# Patient Record
Sex: Female | Born: 1935 | Hispanic: No | Marital: Married | Smoking: Former smoker
Health system: Southern US, Community
[De-identification: ages and names within clinical notes are randomized; demographics above are authoritative.]

## PROBLEM LIST (undated history)

## (undated) DIAGNOSIS — E119 Type 2 diabetes mellitus without complications: Secondary | ICD-10-CM

## (undated) DIAGNOSIS — I739 Peripheral vascular disease, unspecified: Secondary | ICD-10-CM

## (undated) DIAGNOSIS — Z8719 Personal history of other diseases of the digestive system: Secondary | ICD-10-CM

## (undated) DIAGNOSIS — J189 Pneumonia, unspecified organism: Secondary | ICD-10-CM

## (undated) DIAGNOSIS — Z950 Presence of cardiac pacemaker: Secondary | ICD-10-CM

## (undated) DIAGNOSIS — J45909 Unspecified asthma, uncomplicated: Secondary | ICD-10-CM

## (undated) DIAGNOSIS — M1990 Unspecified osteoarthritis, unspecified site: Secondary | ICD-10-CM

## (undated) DIAGNOSIS — I2510 Atherosclerotic heart disease of native coronary artery without angina pectoris: Secondary | ICD-10-CM

## (undated) DIAGNOSIS — M810 Age-related osteoporosis without current pathological fracture: Secondary | ICD-10-CM

## (undated) DIAGNOSIS — K219 Gastro-esophageal reflux disease without esophagitis: Secondary | ICD-10-CM

## (undated) DIAGNOSIS — I219 Acute myocardial infarction, unspecified: Secondary | ICD-10-CM

## (undated) DIAGNOSIS — I10 Essential (primary) hypertension: Secondary | ICD-10-CM

## (undated) HISTORY — PX: ABDOMINAL HYSTERECTOMY: SHX81

## (undated) HISTORY — PX: EYE SURGERY: SHX253

## (undated) HISTORY — PX: APPENDECTOMY: SHX54

## (undated) HISTORY — PX: KNEE ARTHROSCOPY: SUR90

## (undated) HISTORY — PX: BREAST SURGERY: SHX581

## (undated) HISTORY — PX: FOOT SURGERY: SHX648

## (undated) HISTORY — PX: OTHER SURGICAL HISTORY: SHX169

## (undated) HISTORY — PX: BACK SURGERY: SHX140

## (undated) HISTORY — PX: TONSILLECTOMY: SUR1361

## (undated) HISTORY — PX: COLONOSCOPY: SHX174

## (undated) HISTORY — PX: CARDIAC CATHETERIZATION: SHX172

## (undated) HISTORY — PX: CORONARY ANGIOPLASTY: SHX604

## (undated) HISTORY — PX: ROTATOR CUFF REPAIR: SHX139

---

## 2011-10-30 ENCOUNTER — Encounter (INDEPENDENT_AMBULATORY_CARE_PROVIDER_SITE_OTHER): Payer: Medicare Other | Admitting: Ophthalmology

## 2011-10-30 ENCOUNTER — Encounter (INDEPENDENT_AMBULATORY_CARE_PROVIDER_SITE_OTHER): Payer: Self-pay | Admitting: Ophthalmology

## 2011-10-30 DIAGNOSIS — E11319 Type 2 diabetes mellitus with unspecified diabetic retinopathy without macular edema: Secondary | ICD-10-CM

## 2011-10-30 DIAGNOSIS — H43819 Vitreous degeneration, unspecified eye: Secondary | ICD-10-CM

## 2011-10-30 DIAGNOSIS — H26499 Other secondary cataract, unspecified eye: Secondary | ICD-10-CM

## 2011-10-30 DIAGNOSIS — E1139 Type 2 diabetes mellitus with other diabetic ophthalmic complication: Secondary | ICD-10-CM

## 2011-11-12 ENCOUNTER — Encounter (INDEPENDENT_AMBULATORY_CARE_PROVIDER_SITE_OTHER): Payer: Medicare Other | Admitting: Ophthalmology

## 2011-11-12 DIAGNOSIS — H2700 Aphakia, unspecified eye: Secondary | ICD-10-CM

## 2018-11-24 ENCOUNTER — Other Ambulatory Visit: Payer: Self-pay

## 2018-11-24 DIAGNOSIS — R0989 Other specified symptoms and signs involving the circulatory and respiratory systems: Secondary | ICD-10-CM

## 2018-12-21 ENCOUNTER — Ambulatory Visit (INDEPENDENT_AMBULATORY_CARE_PROVIDER_SITE_OTHER): Payer: Medicare Other | Admitting: Vascular Surgery

## 2018-12-21 ENCOUNTER — Encounter: Payer: Self-pay | Admitting: Vascular Surgery

## 2018-12-21 ENCOUNTER — Other Ambulatory Visit (HOSPITAL_COMMUNITY): Payer: Medicare Other

## 2018-12-21 ENCOUNTER — Encounter: Payer: Medicare Other | Admitting: Vascular Surgery

## 2018-12-21 VITALS — BP 148/86 | HR 72 | Temp 97.1°F | Resp 16 | Ht 62.0 in | Wt 146.6 lb

## 2018-12-21 DIAGNOSIS — I6521 Occlusion and stenosis of right carotid artery: Secondary | ICD-10-CM

## 2018-12-21 NOTE — Progress Notes (Signed)
V[facility] and [facility]in Specialist of [place]  Patient name: Lorraine Hill MRN: [id number] DOB: 1931/10/27 Sex: female  REASON FOR CONSULT: Evaluation of asymptomatic right carotid stenosis  HPI: Lorraine Reellma Adkins is a 84 y.o. female, who is here today for evaluation of asymptomatic right carotid stenosis.  She was found to have a carotid bruit by Dr. Georganna SkeansPainter and subsequently underwent carotid duplex for further evaluation.  She specifically denies any prior history of amaurosis fugax, transient ischemic attack, a aphasia or stroke.  She does report occasional feeling of this when in bed rolling from side to side.  Have history of prior coronary disease having undergone stenting in March 2018.  Also has pacemaker.  She lives independently with her husband.  Her daughter is here with her today and helps take care of her as well.  Smoke cigarettes having quit in 1975.  He is a insulin-dependent diabetic.  History reviewed. No pertinent past medical history.  History reviewed. No pertinent family history.  SOCIAL HISTORY: Social History   Socioeconomic History  . Marital status: Unknown    Spouse name: Not on file  . Number of children: Not on file  . Years of education: Not on file  . Highest education level: Not on file  Occupational History  . Not on file  Social Needs  . Financial resource strain: Not on file  . Food insecurity:    Worry: Not on file    Inability: Not on file  . Transportation needs:    Medical: Not on file    Non-medical: Not on file  Tobacco Use  . Smoking status: Not on file  Substance and Sexual Activity  . Alcohol use: Not on file  . Drug use: Not on file  . Sexual activity: Not on file  Lifestyle  . Physical activity:    Days per week: Not on file    Minutes per session: Not on file  . Stress: Not on file  Relationships  . Social connections:    Talks on phone: Not on file    Gets together: Not on file    Attends  religious service: Not on file    Active member of club or organization: Not on file    Attends meetings of clubs or organizations: Not on file    Relationship status: Not on file  . Intimate partner violence:    Fear of current or ex partner: Not on file    Emotionally abused: Not on file    Physically abused: Not on file    Forced sexual activity: Not on file  Other Topics Concern  . Not on file  Social History Narrative  . Not on file    Allergies  Allergen Reactions  . Lipitor [Atorvastatin]   . Penicillins     Current Outpatient Medications  Medication Sig Dispense Refill  . aspirin EC 81 MG tablet Take 81 mg by mouth daily.    . clopidogrel (PLAVIX) 75 MG tablet Take 75 mg by mouth daily.    . ergocalciferol (VITAMIN D2) 1.25 MG (50000 UT) capsule Take 50,000 Units by mouth once a week.    . insulin glargine (LANTUS) 100 UNIT/ML injection Inject into the skin daily.    . sacubitril-valsartan (ENTRESTO) 24-26 MG Take 1 tablet by mouth 2 (two) times daily.    . sertraline (ZOLOFT) 50 MG tablet Take 50 mg by mouth daily.    . vitamin B-12 (CYANOCOBALAMIN) 1000 MCG tablet Take 1,000 mcg by mouth daily.  No current facility-administered medications for this visit.     REVIEW OF SYSTEMS:  [X]  denotes positive finding, [ ]  denotes negative finding Cardiac  Comments:  Chest pain or chest pressure:    Shortness of breath upon exertion:    Short of breath when lying flat:    Irregular heart rhythm: x       Vascular    Pain in calf, thigh, or hip brought on by ambulation:    Pain in feet at night that wakes you up from your sleep:     Blood clot in your veins:    Leg swelling:         Pulmonary    Oxygen at home:    Productive cough:     Wheezing:         Neurologic    Sudden weakness in arms or legs:     Sudden numbness in arms or legs:     Sudden onset of difficulty speaking or slurred speech:    Temporary loss of vision in one eye:     Problems with  dizziness:         Gastrointestinal    Blood in stool:     Vomited blood:         Genitourinary    Burning when urinating:     Blood in urine:        Psychiatric    Major depression:         Hematologic    Bleeding problems:    Problems with blood clotting too easily:        Skin    Rashes or ulcers:        Constitutional    Fever or chills:      PHYSICAL EXAM: Vitals:   12/21/18 1427  BP: (!) 148/86  Pulse: 72  Resp: 16  Temp: (!) 97.1 F (36.2 C)  TempSrc: Temporal  Weight: 146 lb 9.6 oz (66.5 kg)  Height: 5\' 2"  (1.575 m)    GENERAL: The patient is a well-nourished female, in no acute distress. The vital signs are documented above. CARDIOVASCULAR: Soft right carotid bruit.  No bruit on the left.  2+ radial and 2+ dorsalis pedis pulses bilaterally PULMONARY: There is good air exchange  ABDOMEN: Soft and non-tender  MUSCULOSKELETAL: There are no major deformities or cyanosis. NEUROLOGIC: No focal weakness or paresthesias are detected. SKIN: There are no ulcers or rashes noted. PSYCHIATRIC: The patient has a normal affect.  DATA:  Carotid duplex from 01/12/2017 was reviewed.  This reveals systolic velocities of 290 cm/s in the right internal carotid and end-diastolic velocities of 68 cm/s.  Interpretation was of 70 to 80% stenosis.  MEDICAL ISSUES: Asymptomatic right internal carotid artery stenosis.  I had a long discussion with the patient and her daughter regarding this.  She is at the threshold of recommendation for elective endarterectomy for asymptomatic disease.  I have recommended CT angiogram of her neck for further delineation of her degree of stenosis in the level of stenosis in the internal carotid artery.  We will make further recommendations regarding ongoing duplex surveillance versus right carotid endarterectomy based on her CT scan.  I did explain the procedure of endarterectomy with expected 1 day hospitalization and expected 1 to 1-1/2% risk of  stroke with surgery.  We will coordinate her outpatient CT scan and office visit in the next several weeks.   Larina Earthlyodd F. Early, MD FACS [facility] Office Tel [phone]  [phone] Pager [phone]

## 2018-12-22 ENCOUNTER — Encounter: Payer: Self-pay | Admitting: Cardiovascular Disease

## 2018-12-22 ENCOUNTER — Other Ambulatory Visit: Payer: Self-pay

## 2018-12-22 DIAGNOSIS — I6521 Occlusion and stenosis of right carotid artery: Secondary | ICD-10-CM

## 2018-12-22 DIAGNOSIS — R0989 Other specified symptoms and signs involving the circulatory and respiratory systems: Secondary | ICD-10-CM

## 2019-01-11 ENCOUNTER — Ambulatory Visit (HOSPITAL_COMMUNITY)
Admission: RE | Admit: 2019-01-11 | Discharge: 2019-01-11 | Disposition: A | Discharge status: Home / Self Care | Payer: Medicare Other | Source: Ambulatory Visit | Attending: Vascular Surgery | Admitting: Vascular Surgery

## 2019-01-11 ENCOUNTER — Encounter: Payer: Self-pay | Admitting: Vascular Surgery

## 2019-01-11 ENCOUNTER — Ambulatory Visit (INDEPENDENT_AMBULATORY_CARE_PROVIDER_SITE_OTHER): Payer: Medicare Other | Admitting: Vascular Surgery

## 2019-01-11 VITALS — BP 188/83 | HR 66 | Temp 96.6°F | Resp 16 | Ht 62.0 in | Wt 146.0 lb

## 2019-01-11 DIAGNOSIS — I6521 Occlusion and stenosis of right carotid artery: Secondary | ICD-10-CM | POA: Insufficient documentation

## 2019-01-11 DIAGNOSIS — R0989 Other specified symptoms and signs involving the circulatory and respiratory systems: Secondary | ICD-10-CM | POA: Insufficient documentation

## 2019-01-11 LAB — POCT I-STAT CREATININE: Creatinine, Ser: 1.4 mg/dL — ABNORMAL HIGH (ref 0.44–1.00)

## 2019-01-11 MED ORDER — IOPAMIDOL (ISOVUE-370) INJECTION 76%
100.0000 mL | Freq: Once | INTRAVENOUS | Status: AC | PRN
  Administered 2019-01-11: 75 mL via INTRAVENOUS

## 2019-01-11 NOTE — Progress Notes (Signed)
Vascular and Vein Specialist of [facility]  Patient name: Lorraine Hill MRN: [id number] DOB: Oct 20, 1931 Sex: female   Seen today in our [place] office  REASON FOR VISIT: Follow-up right carotid stenosis with CT angiogram  HPI: Lorraine Hill is a 85 y.o. female here today for follow-up.  In her several weeks ago with carotid duplex showing high-grade right carotid stenosis.  She is asymptomatic.  She continues to have no neurologic deficits.  She underwent CT angiogram today at [facility] and is here today with her daughter for further discussion.  History reviewed. No pertinent past medical history.  History reviewed. No pertinent family history.  SOCIAL HISTORY: Social History   Tobacco Use  . Smoking status: Not on file  Substance Use Topics  . Alcohol use: Not on file    Allergies  Allergen Reactions  . Lipitor [Atorvastatin]   . Penicillins     Current Outpatient Medications  Medication Sig Dispense Refill  . aspirin EC 81 MG tablet Take 81 mg by mouth daily.    . clopidogrel (PLAVIX) 75 MG tablet Take 75 mg by mouth daily.    . ergocalciferol (VITAMIN D2) 1.25 MG (50000 UT) capsule Take 50,000 Units by mouth once a week.    . insulin glargine (LANTUS) 100 UNIT/ML injection Inject into the skin daily.    . sacubitril-valsartan (ENTRESTO) 24-26 MG Take 1 tablet by mouth 2 (two) times daily.    . sertraline (ZOLOFT) 50 MG tablet Take 50 mg by mouth daily.    . vitamin B-12 (CYANOCOBALAMIN) 1000 MCG tablet Take 1,000 mcg by mouth daily.     No current facility-administered medications for this visit.     REVIEW OF SYSTEMS:  [X]  denotes positive finding, [ ]  denotes negative finding Cardiac  Comments:  Chest pain or chest pressure:    Shortness of breath upon exertion:    Short of breath when lying flat:    Irregular heart rhythm: x       Vascular    Pain in calf, thigh, or hip brought on by ambulation:    Pain in  feet at night that wakes you up from your sleep:     Blood clot in your veins:    Leg swelling:           PHYSICAL EXAM: Vitals:   01/11/19 1034  BP: (!) 188/83  Pulse: 66  Resp: 16  Temp: (!) 96.6 F (35.9 C)  TempSrc: Temporal  Weight: 146 lb (66.2 kg)  Height: 5\' 2"  (1.575 m)    GENERAL: The patient is a well-nourished female, in no acute distress. The vital signs are documented above. CARDIOVASCULAR: 2+ radial pulses bilaterally.  Carotid artery was soft right carotid bruit PULMONARY: There is good air exchange  MUSCULOSKELETAL: There are no major deformities or cyanosis. NEUROLOGIC: No focal weakness or paresthesias are detected. SKIN: There are no ulcers or rashes noted. PSYCHIATRIC: The patient has a normal affect.  DATA:  CT angiogram reveals high-grade 75 to 80% right internal carotid artery stenosis just at the carotid bifurcation.  The internal carotid artery above and the common carotid artery below are widely patent with minimal atherosclerotic change.  Her bifurcation is in the normal location and is not high.  MEDICAL ISSUES: I had a long discussion with the patient and her daughter present.  And that this degree of narrowing puts her at approximately 5 %/year risk of neurologic deficit.  I explained the surgical procedure for correcting this  with carotid endarterectomy.  I explained the 1 to 1-1/2% risk of stroke with surgery and the very slight risk of cranial nerve injury.  She is 37 but has no major medical issues and is very independent.  She wishes to proceed with endarterectomy.  She is holding her Plavix and continuing her aspirin currently.  We will schedule surgery at her earliest convenience    Larina Earthly, MD [facility] Vascular and Vein Specialists of [facility] Tel [phone] Pager [phone]

## 2019-01-11 NOTE — H&P (View-Only) (Signed)
  Vascular and Vein Specialist of [place]  Patient name: Lorraine Hill MRN: [id number] DOB: 08/23/1931 Sex: female   Seen today in our [place] office  REASON FOR VISIT: Follow-up right carotid stenosis with CT angiogram  HPI: Lorraine Hill is a 85 y.o. female here today for follow-up.  In her several weeks ago with carotid duplex showing high-grade right carotid stenosis.  She is asymptomatic.  She continues to have no neurologic deficits.  She underwent CT angiogram today at [facility] and is here today with her daughter for further discussion.  History reviewed. No pertinent past medical history.  History reviewed. No pertinent family history.  SOCIAL HISTORY: Social History   Tobacco Use  . Smoking status: Not on file  Substance Use Topics  . Alcohol use: Not on file    Allergies  Allergen Reactions  . Lipitor [Atorvastatin]   . Penicillins     Current Outpatient Medications  Medication Sig Dispense Refill  . aspirin EC 81 MG tablet Take 81 mg by mouth daily.    . clopidogrel (PLAVIX) 75 MG tablet Take 75 mg by mouth daily.    . ergocalciferol (VITAMIN D2) 1.25 MG (50000 UT) capsule Take 50,000 Units by mouth once a week.    . insulin glargine (LANTUS) 100 UNIT/ML injection Inject into the skin daily.    . sacubitril-valsartan (ENTRESTO) 24-26 MG Take 1 tablet by mouth 2 (two) times daily.    . sertraline (ZOLOFT) 50 MG tablet Take 50 mg by mouth daily.    . vitamin B-12 (CYANOCOBALAMIN) 1000 MCG tablet Take 1,000 mcg by mouth daily.     No current facility-administered medications for this visit.     REVIEW OF SYSTEMS:  [X] denotes positive finding, [ ] denotes negative finding Cardiac  Comments:  Chest pain or chest pressure:    Shortness of breath upon exertion:    Short of breath when lying flat:    Irregular heart rhythm: x       Vascular    Pain in calf, thigh, or hip brought on by ambulation:    Pain in  feet at night that wakes you up from your sleep:     Blood clot in your veins:    Leg swelling:           PHYSICAL EXAM: Vitals:   01/11/19 1034  BP: (!) 188/83  Pulse: 66  Resp: 16  Temp: (!) 96.6 F (35.9 C)  TempSrc: Temporal  Weight: 146 lb (66.2 kg)  Height: 5' 2" (1.575 m)    GENERAL: The patient is a well-nourished female, in no acute distress. The vital signs are documented above. CARDIOVASCULAR: 2+ radial pulses bilaterally.  Carotid artery was soft right carotid bruit PULMONARY: There is good air exchange  MUSCULOSKELETAL: There are no major deformities or cyanosis. NEUROLOGIC: No focal weakness or paresthesias are detected. SKIN: There are no ulcers or rashes noted. PSYCHIATRIC: The patient has a normal affect.  DATA:  CT angiogram reveals high-grade 75 to 80% right internal carotid artery stenosis just at the carotid bifurcation.  The internal carotid artery above and the common carotid artery below are widely patent with minimal atherosclerotic change.  Her bifurcation is in the normal location and is not high.  MEDICAL ISSUES: I had a long discussion with the patient and her daughter present.  And that this degree of narrowing puts her at approximately 5 %/year risk of neurologic deficit.  I explained the surgical procedure for correcting this   with carotid endarterectomy.  I explained the 1 to 1-1/2% risk of stroke with surgery and the very slight risk of cranial nerve injury.  She is 85 but has no major medical issues and is very independent.  She wishes to proceed with endarterectomy.  She is holding her Plavix and continuing her aspirin currently.  We will schedule surgery at her earliest convenience    Larina Earthly, MD [facility] Vascular and Vein Specialists of [facility] Tel [phone] Pager [phone]

## 2019-01-12 ENCOUNTER — Telehealth: Payer: Self-pay

## 2019-01-12 ENCOUNTER — Other Ambulatory Visit: Payer: Self-pay

## 2019-01-12 NOTE — Telephone Encounter (Signed)
Spoke to pt's daughter Fannie Knee to schedule pt's surgery. Gave daughter pre-op instructions and per Dr. Arbie Cookey to continue holding Plavix. Daughter verbalized understanding.

## 2019-01-21 ENCOUNTER — Encounter (HOSPITAL_COMMUNITY)
Admission: RE | Admit: 2019-01-21 | Discharge: 2019-01-21 | Disposition: A | Discharge status: Home / Self Care | Payer: Medicare Other | Source: Ambulatory Visit | Attending: Vascular Surgery | Admitting: Vascular Surgery

## 2019-01-21 ENCOUNTER — Other Ambulatory Visit: Payer: Self-pay

## 2019-01-21 ENCOUNTER — Telehealth: Payer: Self-pay | Admitting: *Deleted

## 2019-01-21 ENCOUNTER — Encounter (HOSPITAL_COMMUNITY): Payer: Self-pay

## 2019-01-21 DIAGNOSIS — N390 Urinary tract infection, site not specified: Secondary | ICD-10-CM

## 2019-01-21 DIAGNOSIS — Z01812 Encounter for preprocedural laboratory examination: Secondary | ICD-10-CM | POA: Diagnosis not present

## 2019-01-21 HISTORY — DX: Age-related osteoporosis without current pathological fracture: M81.0

## 2019-01-21 HISTORY — DX: Unspecified osteoarthritis, unspecified site: M19.90

## 2019-01-21 HISTORY — DX: Atherosclerotic heart disease of native coronary artery without angina pectoris: I25.10

## 2019-01-21 HISTORY — DX: Pneumonia, unspecified organism: J18.9

## 2019-01-21 HISTORY — DX: Gastro-esophageal reflux disease without esophagitis: K21.9

## 2019-01-21 HISTORY — DX: Type 2 diabetes mellitus without complications: E11.9

## 2019-01-21 HISTORY — DX: Essential (primary) hypertension: I10

## 2019-01-21 HISTORY — DX: Personal history of other diseases of the digestive system: Z87.19

## 2019-01-21 HISTORY — DX: Peripheral vascular disease, unspecified: I73.9

## 2019-01-21 HISTORY — DX: Presence of cardiac pacemaker: Z95.0

## 2019-01-21 HISTORY — DX: Acute myocardial infarction, unspecified: I21.9

## 2019-01-21 HISTORY — DX: Unspecified asthma, uncomplicated: J45.909

## 2019-01-21 LAB — COMPREHENSIVE METABOLIC PANEL
ALT: 11 U/L (ref 0–44)
AST: 17 U/L (ref 15–41)
Albumin: 3.7 g/dL (ref 3.5–5.0)
Alkaline Phosphatase: 76 U/L (ref 38–126)
Anion gap: 11 (ref 5–15)
BUN: 27 mg/dL — ABNORMAL HIGH (ref 8–23)
CO2: 14 mmol/L — ABNORMAL LOW (ref 22–32)
Calcium: 8.8 mg/dL — ABNORMAL LOW (ref 8.9–10.3)
Chloride: 111 mmol/L (ref 98–111)
Creatinine, Ser: 1.5 mg/dL — ABNORMAL HIGH (ref 0.44–1.00)
GFR calc Af Amer: 36 mL/min — ABNORMAL LOW (ref >60)
GFR calc non Af Amer: 31 mL/min — ABNORMAL LOW (ref >60)
Glucose, Bld: 121 mg/dL — ABNORMAL HIGH (ref 70–99)
Potassium: 4.7 mmol/L (ref 3.5–5.1)
Sodium: 136 mmol/L (ref 135–145)
Total Bilirubin: 0.7 mg/dL (ref 0.3–1.2)
Total Protein: 7.6 g/dL (ref 6.5–8.1)

## 2019-01-21 LAB — GLUCOSE, CAPILLARY: Glucose-Capillary: 129 mg/dL — ABNORMAL HIGH (ref 70–99)

## 2019-01-21 LAB — CBC
HCT: 38.5 % (ref 36.0–46.0)
Hemoglobin: 12.6 g/dL (ref 12.0–15.0)
MCH: 28.8 pg (ref 26.0–34.0)
MCHC: 32.7 g/dL (ref 30.0–36.0)
MCV: 88.1 fL (ref 80.0–100.0)
Platelets: 168 10*3/uL (ref 150–400)
RBC: 4.37 MIL/uL (ref 3.87–5.11)
RDW: 13 % (ref 11.5–15.5)
WBC: 7.8 10*3/uL (ref 4.0–10.5)
nRBC: 0 % (ref 0.0–0.2)

## 2019-01-21 LAB — URINALYSIS, ROUTINE W REFLEX MICROSCOPIC
Bilirubin Urine: NEGATIVE
Glucose, UA: NEGATIVE mg/dL
Ketones, ur: NEGATIVE mg/dL
Nitrite: NEGATIVE
Protein, ur: 30 mg/dL — AB
Specific Gravity, Urine: 1.016 (ref 1.005–1.030)
Squamous Epithelial / HPF: >50 — ABNORMAL HIGH (ref 0–5)
pH: 5 (ref 5.0–8.0)

## 2019-01-21 LAB — ABO/RH: ABO/RH(D): A NEG

## 2019-01-21 LAB — SURGICAL PCR SCREEN
MRSA, PCR: NEGATIVE
Staphylococcus aureus: NEGATIVE

## 2019-01-21 LAB — PROTIME-INR
INR: 1.11
Prothrombin Time: 14.2 seconds (ref 11.4–15.2)

## 2019-01-21 LAB — TYPE AND SCREEN
ABO/RH(D): A NEG
Antibody Screen: NEGATIVE

## 2019-01-21 LAB — APTT: aPTT: 33 seconds (ref 24–36)

## 2019-01-21 MED ORDER — CIPROFLOXACIN HCL 500 MG PO TABS: 500.0000 mg | ORAL_TABLET | Freq: Two times a day (BID) | ORAL | 0 refills | Status: DC

## 2019-01-21 NOTE — Progress Notes (Signed)
Notified, Joyce Gross, RN at Dr. Bosie Helper office of UA results.

## 2019-01-21 NOTE — Telephone Encounter (Signed)
Received a call from Lorraine Hill at PAT to alert Dr. Arbie Cookey that this Lorraine Hill had an abnormal UA at her preop testing (Right CEA is scheduled for 01-28-2019). I have called the [facility] pharmacy in [place], [place] per her daughter's request. Lorraine Hill will start Cipro today according to [facility] protocol. Daughter, Lorraine Hill, voice agreement and understanding of the need for this antibiotic.

## 2019-01-21 NOTE — Pre-Procedure Instructions (Addendum)
Lorraine Hill  01/21/2019    Your procedure is scheduled on Thursday, January 28, 2019 at 7:30 AM.   Report to [facility] Entrance "A" Admitting Office at 5:30 AM.   Call this number if you have problems the morning of surgery: [phone]   Questions prior to day of surgery, please call [phone] between 8 & 4 PM.   Remember:  Do not eat or drink after midnight Wednesday. 01/27/19.  Take these medicines the morning of surgery with A SIP OF WATER: Aspirin, Omeprazole (Prilosec), Sertraline (Zoloft)  Take 1/2 of your regular dose of Lantus Insulin the morning of surgery. You will take 5 units.   How to Manage Your Diabetes Before Surgery   Why is it important to control my blood sugar before and after surgery?   Improving blood sugar levels before and after surgery helps healing and can limit problems.  A way of improving blood sugar control is eating a healthy diet by:  - Eating less sugar and carbohydrates  - Increasing activity/exercise  - Talk with your doctor about reaching your blood sugar goals  High blood sugars (greater than 180 mg/dL) can raise your risk of infections and slow down your recovery so you will need to focus on controlling your diabetes during the weeks before surgery.  Make sure that the doctor who takes care of your diabetes knows about your planned surgery including the date and location.  How do I manage my blood sugars before surgery?   Check your blood sugar at least 4 times a day, 2 days before surgery to make sure that they are not too high or low.  Check your blood sugar the morning of your surgery when you wake up and every 2 hours until you get to the Short-Stay unit.  Treat a low blood sugar (less than 70 mg/dL) with 1/2 cup of clear juice (cranberry or apple), 4 glucose tablets, OR glucose gel.  Recheck blood sugar in 15 minutes after treatment (to make sure it is greater than 70 mg/dL).  If blood sugar is not greater than 70  mg/dL on re-check, call [phone] for further instructions.   Report your blood sugar to the Short-Stay nurse when you get to Short-Stay.  References:  [facility], 2007 "How to Manage your Diabetes Before and After Surgery".   Do not wear jewelry, make-up or nail polish.  Do not wear lotions, powders, perfumes or deodorant.  Do not shave 48 hours prior to surgery.    Do not bring valuables to the hospital.  [facility] is not responsible for any belongings or valuables.  Contacts, dentures or bridgework may not be worn into surgery.  Leave your suitcase in the car.  After surgery it may be brought to your room.  For patients admitted to the hospital, discharge time will be determined by your treatment team.  [facility] - Preparing for Surgery  Before surgery, you can play an important role.  Because skin is not sterile, your skin needs to be as free of germs as possible.  You can reduce the number of germs on you skin by washing with CHG (chlorahexidine gluconate) soap before surgery.  CHG is an antiseptic cleaner which kills germs and bonds with the skin to continue killing germs even after washing.  Oral Hygiene is also important in reducing the risk of infection.  Remember to brush your teeth with your regular toothpaste the morning of surgery.  Please DO NOT  use if you have an allergy to CHG or antibacterial soaps.  If your skin becomes reddened/irritated stop using the CHG and inform your nurse when you arrive at Short Stay.  Do not shave (including legs and underarms) for at least 48 hours prior to the first CHG shower.  You may shave your face.  Please follow these instructions carefully:   1.  Shower with CHG Soap the night before surgery and the morning of Surgery.  2.  If you choose to wash your hair, wash your hair first as usual with your normal shampoo.  3.  After you shampoo, rinse your hair and body thoroughly to remove the shampoo. 4.  Use  CHG as you would any other liquid soap.  You can apply chg directly to the skin and wash gently with a      scrungie or washcloth.           5.  Apply the CHG Soap to your body ONLY FROM THE NECK DOWN.   Do not use on open wounds or open sores. Avoid contact with your eyes, ears, mouth and genitals (private parts).  Wash genitals (private parts) with your normal soap.  6.  Wash thoroughly, paying special attention to the area where your surgery will be performed.  7.  Thoroughly rinse your body with warm water from the neck down.  8.  DO NOT shower/wash with your normal soap after using and rinsing off the CHG Soap.  9.  Pat yourself dry with a clean towel.            10.  Wear clean pajamas.            11.  Place clean sheets on your bed the night of your first shower and do not sleep with pets.  Day of Surgery  Shower as above. Do not apply any lotions/deodorants the morning of surgery.   Please wear clean clothes to the hospital. Remember to brush your teeth with toothpaste.   Please read over the fact sheets that you were given.

## 2019-01-21 NOTE — Progress Notes (Addendum)
PCP - Dr. Delorise Jackson, [place], [place] Cardiologist - Dr. Danella Maiers, [place], [place]  Chest x-ray - N/A EKG - requested from office Stress Test - none ECHO - 12/14/12 (Epic) Cardiac Cath - 12/15/12 (Epic)  Pt has a pacemaker  Sleep Study - none CPAP - N/A  Fasting Blood Sugar -  Checks Blood Sugar ___1__ times a day A1C - 6.2 on 11/30/18  Blood Thinner Instructions: Plavix - held since 01/13/19 Aspirin Instructions: to continue  Anesthesia review: Heart Hx  Patient denies shortness of breath, fever, cough and chest pain at PAT appointment   Patient verbalized understanding of instructions that were given to them at the PAT appointment. Patient was also instructed that they will need to review over the PAT instructions again at home before surgery.

## 2019-01-22 LAB — BLOOD GAS, ARTERIAL
Acid-base deficit: 4.9 mmol/L — ABNORMAL HIGH (ref 0.0–2.0)
Bicarbonate: 19.6 mmol/L — ABNORMAL LOW (ref 20.0–28.0)
Drawn by: 470591
FIO2: 21
O2 Saturation: 96.3 %
Patient temperature: 98.6
pCO2 arterial: 35.9 mmHg (ref 32.0–48.0)
pH, Arterial: 7.356 (ref 7.350–7.450)
pO2, Arterial: 84.4 mmHg (ref 83.0–108.0)

## 2019-01-22 NOTE — Progress Notes (Signed)
Anesthesia Chart Review:  Case:  [id number] Date/Time:  01/28/19 0715   Procedure:  ENDARTERECTOMY CAROTID (Right )   Anesthesia type:  Choice   Pre-op diagnosis:  RIGHT CAROTID STENOSIS   Location:  [facility] ROOM 12 / [facility] OR   Surgeon:  Larina EarthlyEarly, Todd F, MD      DISCUSSION:  Pertinent hx includes DMII, HTN, NSTEMI/CAD (s/p stent to LCx 2018), Dilated cardiomyopathy (EF normalized, 60% by echo 10/2017), CKD III, Presence of cardiac pacemaker.  Pt follows with cardiologist Dr. Danella MaiersJack Painter. Per last OV note 11/11/2018 the pt was doing well, no chest pain, dizziness, or SOB. She has a history of cardiomyoapthy with reduced EF, however per his note "Last EF normal on echo after stents to left circumflex 03/11/2017." At that visit she was noted to have a carotid bruit and carotid US 11/12/18 showed 70-80% stenosis of the right ICA. Dr. Georganna SkeansPainter referred her to [facility] fpr management of carotid stenosis.  Echo 11/20/17 with EF 60%, no significant valvular abnormalities.   Cardiac device form on pt chart.  Anticipate she can proceed as planned barring acute status change.  VS: BP 138/71   Pulse 79   Temp 36.4 C   Resp 18   Ht 5\' 2"  (1.575 m)   SpO2 98%   BMI 26.70 kg/m   PROVIDERS: Valla LeaverEggleston-Clark, Valenica, MD is PCP  Danella MaiersPainter, Jack, MD is Cardiologist  LABS: Labs reviewed: Acceptable for surgery. Elevated creatinine c/w pt's CKD III. Review of recent labs in care everywhere shows similar. (all labs ordered are listed, but only abnormal results are displayed)  Labs Reviewed  GLUCOSE, CAPILLARY - Abnormal; Notable for the following components:      Result Value   Glucose-Capillary 129 (*)    All other components within normal limits  BLOOD GAS, ARTERIAL - Abnormal; Notable for the following components:   Bicarbonate 19.6 (*)    Acid-base deficit 4.9 (*)    All other components within normal limits  COMPREHENSIVE METABOLIC PANEL - Abnormal; Notable for the following components:   CO2 14 (*)     Glucose, Bld 121 (*)    BUN 27 (*)    Creatinine, Ser 1.50 (*)    Calcium 8.8 (*)    GFR calc non Af Amer 31 (*)    GFR calc Af Amer 36 (*)    All other components within normal limits  URINALYSIS, ROUTINE W REFLEX MICROSCOPIC - Abnormal; Notable for the following components:   APPearance CLOUDY (*)    Hgb urine dipstick SMALL (*)    Protein, ur 30 (*)    Leukocytes, UA LARGE (*)    Bacteria, UA FEW (*)    Squamous Epithelial / LPF >50 (*)    All other components within normal limits  SURGICAL PCR SCREEN  APTT  CBC  PROTIME-INR  TYPE AND SCREEN  ABO/RH     IMAGES: CTA Neck 01/11/19: IMPRESSION: 1. 75% atheromatous narrowing at the right ICA bulb. 2. No other flow limiting stenosis in the neck. 3. 2.7 cm right thyroid nodule which could be followed with sonography if appropriate for comorbidities. 4. Active sinusitis.  EKG: 11/11/2018 (copy on pt chart): Paced rhythm. Rate 60.  CV: Carotid US 11/19/2018: Conclusions: 1.  70 to 80% stenosis of the right internal carotid artery. 2.  The right vertebral artery demonstrates antegrade flow. 3.  Mild amount of atherosclerotic plaque is noted in the left ICA. 4.  Left vertebral artery demonstrates antegrade flow.  TTE 11/20/2017 (  outside record, copy on pt chart): Conclusions: 1.  Normal LV chamber size and normal LV systolic function.  LVEF 60%. 2.  Trivial mitral regurgitation. 3.  Trivial tricuspid rotation. 4.  Aortic valve sclerosis with no evidence of aortic stenosis. 5.  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 6.  Mild concentric left ventricular hypertrophy. 7.  Mild pulmonary hypertension.  Estimated PASP 32 mmHg. 8.  No pericardial effusion and no obvious intracardiac masses or thrombi.  Past Medical History:  Diagnosis Date  . Arthritis   . Asthma   . Coronary artery disease    2 stents in 2013  . Diabetes mellitus without complication (HCC)    Type 2  .  GERD (gastroesophageal reflux disease)   . History of hiatal hernia   . Hypertension   . Myocardial infarction (HCC)   . Osteoporosis   . Peripheral vascular disease (HCC)   . Pneumonia   . Presence of permanent cardiac pacemaker     Past Surgical History:  Procedure Laterality Date  . ABDOMINAL HYSTERECTOMY    . APPENDECTOMY    . BACK SURGERY     16 kyphoplasty  . BREAST SURGERY     lumpectomy on right and left breast - benign  . CARDIAC CATHETERIZATION    . COLONOSCOPY    . CORONARY ANGIOPLASTY    . EYE SURGERY Bilateral    lens implants and cataract surgery  . FOOT SURGERY Bilateral    several surgeries for spurs  . goiter surgery    . KNEE ARTHROSCOPY    . ROTATOR CUFF REPAIR Left    x 2  . TONSILLECTOMY      MEDICATIONS: . aspirin EC 81 MG tablet  . ciprofloxacin (CIPRO) 500 MG tablet  . clopidogrel (PLAVIX) 75 MG tablet  . insulin glargine (LANTUS) 100 UNIT/ML injection  . lisinopril-hydrochlorothiazide (PRINZIDE,ZESTORETIC) 20-12.5 MG tablet  . omeprazole (PRILOSEC) 40 MG capsule  . sertraline (ZOLOFT) 50 MG tablet  . vitamin B-12 (CYANOCOBALAMIN) 1000 MCG tablet   No current facility-administered medications for this encounter.      Zannie CoveJames , PA-C [facility]iology Phone [phone] 01/27/2019 12:16 PM

## 2019-01-27 NOTE — Anesthesia Preprocedure Evaluation (Addendum)
Anesthesia Evaluation  Patient identified by MRN, date of birth, ID band Patient awake    Reviewed: Allergy & Precautions, NPO status , Patient's Chart, lab work & pertinent test results  Airway Mallampati: II  TM Distance: >3 FB Neck ROM: Full    Dental  (+) Dental Advisory Given, Upper Dentures   Pulmonary asthma , former smoker,    Pulmonary exam normal breath sounds clear to auscultation       Cardiovascular hypertension, Pt. on medications (-) angina+ CAD, + Past MI, + Cardiac Stents and + Peripheral Vascular Disease (Right carotid stenosis)  (-) CABG Normal cardiovascular exam+ pacemaker  Rhythm:Regular Rate:Normal  TTE 11/20/2017 (outside record, copy on pt chart): Conclusions: 1.  Normal LV chamber size and normal LV systolic function.  LVEF 60%. 2.  Trivial mitral regurgitation. 3.  Trivial tricuspid rotation. 4.  Aortic valve sclerosis with no evidence of aortic stenosis. 5.  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 6.  Mild concentric left ventricular hypertrophy. 7.  Mild pulmonary hypertension.  Estimated PASP 32 mmHg. 8.  No pericardial effusion and no obvious intracardiac masses or thrombi.   Neuro/Psych negative neurological ROS  negative psych ROS   GI/Hepatic Neg liver ROS, hiatal hernia, GERD  Medicated,  Endo/Other  diabetes, Type 2, Insulin Dependent  Renal/GU Renal InsufficiencyRenal disease     Musculoskeletal  (+) Arthritis ,   Abdominal   Peds  Hematology  (+) Blood dyscrasia (Plavix), ,   Anesthesia Other Findings Day of surgery medications reviewed with the patient.  Reproductive/Obstetrics                           Anesthesia Physical Anesthesia Plan  ASA: III  Anesthesia Plan: General   Post-op Pain Management:    Induction: Intravenous  PONV Risk Score and Plan: 3 and Dexamethasone, Ondansetron and Treatment  may vary due to age or medical condition  Airway Management Planned: Oral ETT  Additional Equipment: Arterial line  Intra-op Plan:   Post-operative Plan: Extubation in OR  Informed Consent: I have reviewed the patients History and Physical, chart, labs and discussed the procedure including the risks, benefits and alternatives for the proposed anesthesia with the patient or authorized representative who has indicated his/her understanding and acceptance.     Dental advisory given  Plan Discussed with: CRNA  Anesthesia Plan Comments:       Anesthesia Quick Evaluation

## 2019-01-28 ENCOUNTER — Inpatient Hospital Stay (HOSPITAL_COMMUNITY): Payer: Medicare Other | Admitting: Physician Assistant

## 2019-01-28 ENCOUNTER — Inpatient Hospital Stay (HOSPITAL_COMMUNITY): Payer: Medicare Other | Admitting: Anesthesiology

## 2019-01-28 ENCOUNTER — Encounter (HOSPITAL_COMMUNITY)
Admission: RE | Disposition: A | Discharge status: Home / Self Care | Payer: Self-pay | Source: Home / Self Care | Attending: Vascular Surgery

## 2019-01-28 ENCOUNTER — Encounter (HOSPITAL_COMMUNITY): Payer: Self-pay

## 2019-01-28 ENCOUNTER — Inpatient Hospital Stay (HOSPITAL_COMMUNITY)
Admission: RE | Admit: 2019-01-28 | Discharge: 2019-01-29 | DRG: 039 | Disposition: A | Discharge status: Home / Self Care | Payer: Medicare Other | Attending: Vascular Surgery | Admitting: Vascular Surgery

## 2019-01-28 DIAGNOSIS — D759 Disease of blood and blood-forming organs, unspecified: Secondary | ICD-10-CM | POA: Diagnosis present

## 2019-01-28 DIAGNOSIS — I2510 Atherosclerotic heart disease of native coronary artery without angina pectoris: Secondary | ICD-10-CM | POA: Diagnosis present

## 2019-01-28 DIAGNOSIS — Z95818 Presence of other cardiac implants and grafts: Secondary | ICD-10-CM

## 2019-01-28 DIAGNOSIS — K219 Gastro-esophageal reflux disease without esophagitis: Secondary | ICD-10-CM | POA: Diagnosis present

## 2019-01-28 DIAGNOSIS — Z888 Allergy status to other drugs, medicaments and biological substances status: Secondary | ICD-10-CM

## 2019-01-28 DIAGNOSIS — J45909 Unspecified asthma, uncomplicated: Secondary | ICD-10-CM | POA: Diagnosis present

## 2019-01-28 DIAGNOSIS — Z7902 Long term (current) use of antithrombotics/antiplatelets: Secondary | ICD-10-CM

## 2019-01-28 DIAGNOSIS — I10 Essential (primary) hypertension: Secondary | ICD-10-CM | POA: Diagnosis present

## 2019-01-28 DIAGNOSIS — I6521 Occlusion and stenosis of right carotid artery: Principal | ICD-10-CM | POA: Diagnosis present

## 2019-01-28 DIAGNOSIS — E1151 Type 2 diabetes mellitus with diabetic peripheral angiopathy without gangrene: Secondary | ICD-10-CM | POA: Diagnosis present

## 2019-01-28 DIAGNOSIS — I252 Old myocardial infarction: Secondary | ICD-10-CM | POA: Diagnosis not present

## 2019-01-28 DIAGNOSIS — Z87891 Personal history of nicotine dependence: Secondary | ICD-10-CM | POA: Diagnosis not present

## 2019-01-28 DIAGNOSIS — Z79899 Other long term (current) drug therapy: Secondary | ICD-10-CM | POA: Diagnosis not present

## 2019-01-28 DIAGNOSIS — K449 Diaphragmatic hernia without obstruction or gangrene: Secondary | ICD-10-CM | POA: Diagnosis present

## 2019-01-28 DIAGNOSIS — Z880 Allergy status to penicillin: Secondary | ICD-10-CM

## 2019-01-28 DIAGNOSIS — Z794 Long term (current) use of insulin: Secondary | ICD-10-CM

## 2019-01-28 DIAGNOSIS — Z7982 Long term (current) use of aspirin: Secondary | ICD-10-CM

## 2019-01-28 HISTORY — PX: PATCH ANGIOPLASTY: SHX6230

## 2019-01-28 HISTORY — PX: ENDARTERECTOMY: SHX5162

## 2019-01-28 LAB — GLUCOSE, CAPILLARY
Glucose-Capillary: 145 mg/dL — ABNORMAL HIGH (ref 70–99)
Glucose-Capillary: 178 mg/dL — ABNORMAL HIGH (ref 70–99)
Glucose-Capillary: 191 mg/dL — ABNORMAL HIGH (ref 70–99)
Glucose-Capillary: 222 mg/dL — ABNORMAL HIGH (ref 70–99)

## 2019-01-28 SURGERY — ENDARTERECTOMY, CAROTID
Anesthesia: General | Site: Neck | Laterality: Right

## 2019-01-28 MED ORDER — GUAIFENESIN-DM 100-10 MG/5ML PO SYRP: 15.0000 mL | ORAL_SOLUTION | ORAL | Status: DC | PRN

## 2019-01-28 MED ORDER — SUGAMMADEX SODIUM 200 MG/2ML IV SOLN
INTRAVENOUS | Status: DC | PRN
  Administered 2019-01-28: 132.4 mg via INTRAVENOUS

## 2019-01-28 MED ORDER — FENTANYL CITRATE (PF) 250 MCG/5ML IJ SOLN
INTRAMUSCULAR | Status: AC
  Filled 2019-01-28: qty 5

## 2019-01-28 MED ORDER — PROPOFOL 10 MG/ML IV BOLUS
INTRAVENOUS | Status: DC | PRN
  Administered 2019-01-28: 120 mg via INTRAVENOUS
  Administered 2019-01-28: 20 mg via INTRAVENOUS

## 2019-01-28 MED ORDER — ACETAMINOPHEN 325 MG PO TABS: 325.0000 mg | ORAL_TABLET | ORAL | Status: DC | PRN

## 2019-01-28 MED ORDER — ASPIRIN EC 81 MG PO TBEC
81.0000 mg | DELAYED_RELEASE_TABLET | Freq: Every day | ORAL | Status: DC
  Administered 2019-01-29: 81 mg via ORAL
  Filled 2019-01-28: qty 1

## 2019-01-28 MED ORDER — DOCUSATE SODIUM 100 MG PO CAPS
100.0000 mg | ORAL_CAPSULE | Freq: Every day | ORAL | Status: DC
  Administered 2019-01-29: 100 mg via ORAL
  Filled 2019-01-28: qty 1

## 2019-01-28 MED ORDER — ROCURONIUM BROMIDE 100 MG/10ML IV SOLN
INTRAVENOUS | Status: DC | PRN
  Administered 2019-01-28: 50 mg via INTRAVENOUS
  Administered 2019-01-28: 10 mg via INTRAVENOUS

## 2019-01-28 MED ORDER — FENTANYL CITRATE (PF) 250 MCG/5ML IJ SOLN
INTRAMUSCULAR | Status: DC | PRN
  Administered 2019-01-28 (×2): 50 ug via INTRAVENOUS
  Administered 2019-01-28: 100 ug via INTRAVENOUS
  Administered 2019-01-28: 50 ug via INTRAVENOUS

## 2019-01-28 MED ORDER — LACTATED RINGERS IV SOLN
INTRAVENOUS | Status: DC | PRN
  Administered 2019-01-28: 08:00:00 via INTRAVENOUS

## 2019-01-28 MED ORDER — LABETALOL HCL 5 MG/ML IV SOLN
INTRAVENOUS | Status: AC
  Filled 2019-01-28: qty 4

## 2019-01-28 MED ORDER — MORPHINE SULFATE (PF) 2 MG/ML IV SOLN: 2.0000 mg | INTRAVENOUS | Status: DC | PRN

## 2019-01-28 MED ORDER — VANCOMYCIN HCL IN DEXTROSE 1-5 GM/200ML-% IV SOLN
1000.0000 mg | INTRAVENOUS | Status: DC
  Filled 2019-01-28: qty 200

## 2019-01-28 MED ORDER — HYDRALAZINE HCL 20 MG/ML IJ SOLN
5.0000 mg | INTRAMUSCULAR | Status: AC | PRN
  Administered 2019-01-28 (×2): 5 mg via INTRAVENOUS
  Filled 2019-01-28: qty 1

## 2019-01-28 MED ORDER — ONDANSETRON HCL 4 MG/2ML IJ SOLN
INTRAMUSCULAR | Status: DC | PRN
  Administered 2019-01-28: 4 mg via INTRAVENOUS

## 2019-01-28 MED ORDER — POLYETHYLENE GLYCOL 3350 17 G PO PACK: 17.0000 g | PACK | Freq: Every day | ORAL | Status: DC | PRN

## 2019-01-28 MED ORDER — DEXAMETHASONE SODIUM PHOSPHATE 4 MG/ML IJ SOLN
INTRAMUSCULAR | Status: DC | PRN
  Administered 2019-01-28: 10 mg via INTRAVENOUS

## 2019-01-28 MED ORDER — SODIUM CHLORIDE 0.9 % IV SOLN: INTRAVENOUS | Status: DC

## 2019-01-28 MED ORDER — PANTOPRAZOLE SODIUM 40 MG PO TBEC
40.0000 mg | DELAYED_RELEASE_TABLET | Freq: Every day | ORAL | Status: DC
  Administered 2019-01-29: 40 mg via ORAL
  Filled 2019-01-28: qty 1

## 2019-01-28 MED ORDER — ONDANSETRON HCL 4 MG/2ML IJ SOLN: 4.0000 mg | Freq: Four times a day (QID) | INTRAMUSCULAR | Status: DC | PRN

## 2019-01-28 MED ORDER — MIDAZOLAM HCL 2 MG/2ML IJ SOLN
INTRAMUSCULAR | Status: AC
  Filled 2019-01-28: qty 2

## 2019-01-28 MED ORDER — LIDOCAINE HCL (PF) 1 % IJ SOLN
INTRAMUSCULAR | Status: AC
  Filled 2019-01-28: qty 5

## 2019-01-28 MED ORDER — OXYCODONE HCL 5 MG PO TABS: 5.0000 mg | ORAL_TABLET | Freq: Four times a day (QID) | ORAL | 0 refills | Status: DC | PRN

## 2019-01-28 MED ORDER — SODIUM CHLORIDE 0.9 % IV SOLN
INTRAVENOUS | Status: AC
  Filled 2019-01-28: qty 1.2

## 2019-01-28 MED ORDER — 0.9 % SODIUM CHLORIDE (POUR BTL) OPTIME
TOPICAL | Status: DC | PRN
  Administered 2019-01-28: 2000 mL

## 2019-01-28 MED ORDER — PHENOL 1.4 % MT LIQD: 1.0000 | OROMUCOSAL | Status: DC | PRN

## 2019-01-28 MED ORDER — MAGNESIUM SULFATE 2 GM/50ML IV SOLN: 2.0000 g | Freq: Every day | INTRAVENOUS | Status: DC | PRN

## 2019-01-28 MED ORDER — PROPOFOL 10 MG/ML IV BOLUS
INTRAVENOUS | Status: AC
  Filled 2019-01-28: qty 20

## 2019-01-28 MED ORDER — SODIUM CHLORIDE 0.9 % IV SOLN
INTRAVENOUS | Status: DC | PRN
  Administered 2019-01-28: 500 mL

## 2019-01-28 MED ORDER — ROCURONIUM BROMIDE 50 MG/5ML IV SOSY
PREFILLED_SYRINGE | INTRAVENOUS | Status: AC
  Filled 2019-01-28: qty 5

## 2019-01-28 MED ORDER — LABETALOL HCL 5 MG/ML IV SOLN
10.0000 mg | INTRAVENOUS | Status: DC | PRN
  Administered 2019-01-28 (×2): 10 mg via INTRAVENOUS

## 2019-01-28 MED ORDER — LIDOCAINE 2% (20 MG/ML) 5 ML SYRINGE
INTRAMUSCULAR | Status: AC
  Filled 2019-01-28: qty 5

## 2019-01-28 MED ORDER — SODIUM CHLORIDE 0.9 % IV SOLN
0.0125 ug/kg/min | INTRAVENOUS | Status: DC
  Filled 2019-01-28: qty 2000

## 2019-01-28 MED ORDER — POTASSIUM CHLORIDE CRYS ER 20 MEQ PO TBCR: 20.0000 meq | EXTENDED_RELEASE_TABLET | Freq: Every day | ORAL | Status: DC | PRN

## 2019-01-28 MED ORDER — CHLORHEXIDINE GLUCONATE 4 % EX LIQD: 60.0000 mL | Freq: Once | CUTANEOUS | Status: DC

## 2019-01-28 MED ORDER — ACETAMINOPHEN 325 MG RE SUPP: 325.0000 mg | RECTAL | Status: DC | PRN

## 2019-01-28 MED ORDER — ESMOLOL HCL 100 MG/10ML IV SOLN
INTRAVENOUS | Status: DC | PRN
  Administered 2019-01-28: 20 mg via INTRAVENOUS
  Administered 2019-01-28: 10 mg via INTRAVENOUS

## 2019-01-28 MED ORDER — METOPROLOL TARTRATE 5 MG/5ML IV SOLN: 2.0000 mg | INTRAVENOUS | Status: DC | PRN

## 2019-01-28 MED ORDER — SERTRALINE HCL 50 MG PO TABS
75.0000 mg | ORAL_TABLET | Freq: Every day | ORAL | Status: DC
  Administered 2019-01-29: 75 mg via ORAL
  Filled 2019-01-28: qty 1

## 2019-01-28 MED ORDER — ONDANSETRON HCL 4 MG/2ML IJ SOLN: 4.0000 mg | Freq: Once | INTRAMUSCULAR | Status: DC | PRN

## 2019-01-28 MED ORDER — FENTANYL CITRATE (PF) 100 MCG/2ML IJ SOLN: 25.0000 ug | INTRAMUSCULAR | Status: DC | PRN

## 2019-01-28 MED ORDER — LIDOCAINE HCL (CARDIAC) PF 100 MG/5ML IV SOSY
PREFILLED_SYRINGE | INTRAVENOUS | Status: DC | PRN
  Administered 2019-01-28: 100 mg via INTRAVENOUS

## 2019-01-28 MED ORDER — SODIUM CHLORIDE 0.9 % IV SOLN
INTRAVENOUS | Status: DC | PRN
  Administered 2019-01-28: 25 ug/min via INTRAVENOUS

## 2019-01-28 MED ORDER — BISACODYL 10 MG RE SUPP: 10.0000 mg | Freq: Every day | RECTAL | Status: DC | PRN

## 2019-01-28 MED ORDER — SODIUM CHLORIDE 0.9 % IV SOLN: 500.0000 mL | Freq: Once | INTRAVENOUS | Status: DC | PRN

## 2019-01-28 MED ORDER — CLOPIDOGREL BISULFATE 75 MG PO TABS
75.0000 mg | ORAL_TABLET | Freq: Every day | ORAL | Status: DC
  Administered 2019-01-29: 75 mg via ORAL
  Filled 2019-01-28: qty 1

## 2019-01-28 MED ORDER — HEPARIN SODIUM (PORCINE) 1000 UNIT/ML IJ SOLN
INTRAMUSCULAR | Status: DC | PRN
  Administered 2019-01-28: 7000 [IU] via INTRAVENOUS

## 2019-01-28 MED ORDER — VANCOMYCIN HCL 1000 MG IV SOLR
INTRAVENOUS | Status: DC | PRN
  Administered 2019-01-28: 1000 mg via INTRAVENOUS

## 2019-01-28 MED ORDER — INSULIN ASPART 100 UNIT/ML ~~LOC~~ SOLN
0.0000 [IU] | Freq: Three times a day (TID) | SUBCUTANEOUS | Status: DC
  Administered 2019-01-28: 3 [IU] via SUBCUTANEOUS
  Administered 2019-01-29: 5 [IU] via SUBCUTANEOUS

## 2019-01-28 MED ORDER — VANCOMYCIN HCL IN DEXTROSE 750-5 MG/150ML-% IV SOLN
750.0000 mg | INTRAVENOUS | Status: DC
  Administered 2019-01-29: 750 mg via INTRAVENOUS
  Filled 2019-01-28: qty 150

## 2019-01-28 MED ORDER — OXYCODONE HCL 5 MG PO TABS: 5.0000 mg | ORAL_TABLET | ORAL | Status: DC | PRN

## 2019-01-28 MED ORDER — ACETAMINOPHEN 500 MG PO TABS
1000.0000 mg | ORAL_TABLET | Freq: Once | ORAL | Status: AC
  Administered 2019-01-28: 1000 mg via ORAL
  Filled 2019-01-28: qty 2

## 2019-01-28 MED ORDER — PROTAMINE SULFATE 10 MG/ML IV SOLN
INTRAVENOUS | Status: DC | PRN
  Administered 2019-01-28: 50 mg via INTRAVENOUS

## 2019-01-28 MED ORDER — INSULIN GLARGINE 100 UNIT/ML ~~LOC~~ SOLN
10.0000 [IU] | Freq: Every morning | SUBCUTANEOUS | Status: DC
  Administered 2019-01-29: 10 [IU] via SUBCUTANEOUS
  Filled 2019-01-28: qty 0.1

## 2019-01-28 SURGICAL SUPPLY — 42 items
ADH SKN CLS APL DERMABOND .7 (GAUZE/BANDAGES/DRESSINGS) ×1
CANISTER SUCT 3000ML PPV (MISCELLANEOUS) ×2 IMPLANT
CANNULA VESSEL 3MM 2 BLNT TIP (CANNULA) ×4 IMPLANT
CATH ROBINSON RED A/P 18FR (CATHETERS) ×2 IMPLANT
CLIP LIGATING EXTRA MED SLVR (CLIP) ×2 IMPLANT
CLIP LIGATING EXTRA SM BLUE (MISCELLANEOUS) ×2 IMPLANT
COVER WAND RF STERILE (DRAPES) ×2 IMPLANT
CRADLE DONUT ADULT HEAD (MISCELLANEOUS) ×2 IMPLANT
DECANTER SPIKE VIAL GLASS SM (MISCELLANEOUS) IMPLANT
DERMABOND ADVANCED (GAUZE/BANDAGES/DRESSINGS) ×1
DERMABOND ADVANCED .7 DNX12 (GAUZE/BANDAGES/DRESSINGS) ×1 IMPLANT
DRAIN HEMOVAC 1/8 X 5 (WOUND CARE) IMPLANT
DRAPE INCISE IOBAN 66X45 STRL (DRAPES) ×1 IMPLANT
DRAPE ORTHO SPLIT 77X108 STRL (DRAPES) ×4
DRAPE SURG ORHT 6 SPLT 77X108 (DRAPES) IMPLANT
ELECT REM PT RETURN 9FT ADLT (ELECTROSURGICAL) ×2
ELECTRODE REM PT RTRN 9FT ADLT (ELECTROSURGICAL) ×1 IMPLANT
EVACUATOR SILICONE 100CC (DRAIN) IMPLANT
GLOVE SS BIOGEL STRL SZ 7.5 (GLOVE) ×1 IMPLANT
GLOVE SUPERSENSE BIOGEL SZ 7.5 (GLOVE) ×1
GOWN STRL REUS W/ TWL LRG LVL3 (GOWN DISPOSABLE) ×3 IMPLANT
GOWN STRL REUS W/TWL LRG LVL3 (GOWN DISPOSABLE) ×6
KIT BASIN OR (CUSTOM PROCEDURE TRAY) ×2 IMPLANT
KIT SHUNT ARGYLE CAROTID ART 6 (VASCULAR PRODUCTS) IMPLANT
KIT TURNOVER KIT B (KITS) ×2 IMPLANT
NEEDLE 22X1 1/2 (OR ONLY) (NEEDLE) IMPLANT
NS IRRIG 1000ML POUR BTL (IV SOLUTION) ×4 IMPLANT
PACK CAROTID (CUSTOM PROCEDURE TRAY) ×2 IMPLANT
PAD ARMBOARD 7.5X6 YLW CONV (MISCELLANEOUS) ×4 IMPLANT
PATCH HEMASHIELD 8X75 (Vascular Products) ×1 IMPLANT
SHUNT CAROTID BYPASS 10 (VASCULAR PRODUCTS) ×1 IMPLANT
SHUNT CAROTID BYPASS 12FRX15.5 (VASCULAR PRODUCTS) IMPLANT
SUT ETHILON 3 0 PS 1 (SUTURE) IMPLANT
SUT PROLENE 6 0 CC (SUTURE) ×2 IMPLANT
SUT SILK 3 0 (SUTURE)
SUT SILK 3-0 18XBRD TIE 12 (SUTURE) IMPLANT
SUT VIC AB 3-0 SH 27 (SUTURE) ×4
SUT VIC AB 3-0 SH 27X BRD (SUTURE) ×2 IMPLANT
SUT VICRYL 4-0 PS2 18IN ABS (SUTURE) ×2 IMPLANT
SYR CONTROL 10ML LL (SYRINGE) IMPLANT
TOWEL GREEN STERILE (TOWEL DISPOSABLE) ×2 IMPLANT
WATER STERILE IRR 1000ML POUR (IV SOLUTION) ×2 IMPLANT

## 2019-01-28 NOTE — Discharge Instructions (Signed)
° °  [facility] [place] ° °Discharge Instructions °  °Carotid Endarterectomy (CEA) ° °Please refer to the following instructions for your post-procedure care. Your surgeon or physician assistant will discuss any changes with you. ° °Activity ° °You are encouraged to walk as much as you can. You can slowly return to normal activities but must avoid strenuous activity and heavy lifting until your doctor tell you it's okay. Avoid activities such as vacuuming or swinging a golf club. You can drive after one week if you are comfortable and you are no longer taking prescription pain medications. It is normal to feel tired for serval weeks after your surgery. It is also normal to have difficulty with sleep habits, eating, and bowel movements after surgery. These will go away with time. ° °Bathing/Showering ° °Shower daily after you go home. Do not soak in a bathtub, hot tub, or swim until the incision heals completely. ° °Incision Care ° °Shower every day. Clean your incision with mild soap and water. Pat the area dry with a clean towel. You do not need a bandage unless otherwise instructed. Do not apply any ointments or creams to your incision. You may have skin glue on your incision. Do not peel it off. It will come off on its own in about one week. Your incision may feel thickened and raised for several weeks after your surgery. This is normal and the skin will soften over time.  ° °For Men Only: It's okay to shave around the incision but do not shave the incision itself for 2 weeks. It is common to have numbness under your chin that could last for several months. ° °Diet ° °Resume your normal diet. There are no special food restrictions following this procedure. A low fat/low cholesterol diet is recommended for all patients with vascular disease. In order to heal from your surgery, it is CRITICAL to get adequate nutrition. Your body requires vitamins, minerals, and protein. Vegetables are the  best source of vitamins and minerals. Vegetables also provide the perfect balance of protein. Processed food has little nutritional value, so try to avoid this. ° °Medications ° °Resume taking all of your medications unless your doctor or physician assistant tells you not to. If your incision is causing pain, you may take over-the- counter pain relievers such as acetaminophen (Tylenol). If you were prescribed a stronger pain medication, please be aware these medications can cause nausea and constipation. Prevent nausea by taking the medication with a snack or meal. Avoid constipation by drinking plenty of fluids and eating foods with a high amount of fiber, such as fruits, vegetables, and grains.  °Do not take Tylenol if you are taking prescription pain medications. ° °Follow Up ° °Our office will schedule a follow up appointment 2-3 weeks following discharge. ° °Please call us immediately for any of the following conditions ° °Increased pain, redness, drainage (pus) from your incision site. °Fever of 101 degrees or higher. °If you should develop stroke (slurred speech, difficulty swallowing, weakness on one side of your body, loss of vision) you should call 911 and go to the nearest emergency room. ° °Reduce your risk of vascular disease: ° °Stop smoking. If you would like help call [facility] at 1-800-QUIT-NOW (1-800-784-8669) or [place] at [phone]. °Manage your cholesterol °Maintain a desired weight °Control your diabetes °Keep your blood pressure down ° °If you have any questions, please call the office at [phone]. ° °

## 2019-01-28 NOTE — Anesthesia Postprocedure Evaluation (Signed)
Anesthesia Post Note  Patient: Mayra Reel  Procedure(s) Performed: Right Carotid ENDARTERECTOMY (Right Neck) Patch Angioplasty of right carotid artery using hemashield platinum finesse patch (Right Neck)     Patient location during evaluation: PACU Anesthesia Type: General Level of consciousness: awake Pain management: pain level controlled Respiratory status: spontaneous breathing Cardiovascular status: stable Postop Assessment: no apparent nausea or vomiting Anesthetic complications: no    Last Vitals:  Vitals:   01/28/19 1200 01/28/19 1230  BP: (!) 149/61 (!) 153/58  Pulse: 65 61  Resp: 14 15  Temp:    SpO2: 98% 99%    Last Pain:  Vitals:   01/28/19 1230  TempSrc:   PainSc: 0-No pain                 Davin Muramoto

## 2019-01-28 NOTE — Discharge Summary (Signed)
Discharge Summary     Lorraine Hill [birth year] 85 y.o. female  [id number]  Admission Date: 01/28/2019  Discharge Date: 01/29/2019  Physician: No att. providers found  Admission Diagnosis: RIGHT CAROTID STENOSIS   HPI:   This is a 85 y.o. female who several weeks ago with carotid duplex showing high-grade right carotid stenosis.  She is asymptomatic.  She continues to have no neurologic deficits.  She underwent CT angiogram today at [facility] and is here today with her daughter for further discussion.  Hospital Course:  The patient was admitted to the hospital and taken to the operating room on 01/28/2019 and underwent right carotid endarterectomy.    The pt tolerated the procedure well and was transported to the PACU in good condition.   By POD 1, the pt neuro status was at baseline.  She did have some swelling and bruising at the incision but was soft.  She was doing well and discharged home.  The remainder of the hospital course consisted of increasing mobilization and increasing intake of solids without difficulty.   Labs: CBC    Component Value Date/Time   WBC 10.0 01/29/2019 0310   RBC 3.57 (L) 01/29/2019 0310   HGB 10.5 (L) 01/29/2019 0310   HCT 31.8 (L) 01/29/2019 0310   PLT 160 01/29/2019 0310   MCV 89.1 01/29/2019 0310   MCH 29.4 01/29/2019 0310   MCHC 33.0 01/29/2019 0310   RDW 13.3 01/29/2019 0310   BMET    Component Value Date/Time   NA 139 01/29/2019 0310   K 4.3 01/29/2019 0310   CL 108 01/29/2019 0310   CO2 22 01/29/2019 0310   GLUCOSE 172 (H) 01/29/2019 0310   BUN 24 (H) 01/29/2019 0310   CREATININE 1.24 (H) 01/29/2019 0310   CALCIUM 8.7 (L) 01/29/2019 0310   GFRNONAA 40 (L) 01/29/2019 0310   GFRAA 46 (L) 01/29/2019 0310      Discharge Diagnosis:  RIGHT CAROTID STENOSIS  Secondary Diagnosis: Patient Active Problem List   Diagnosis Date Noted  . Asymptomatic stenosis of right carotid artery without infarction 01/28/2019    Past Medical History:  Diagnosis Date  . Arthritis   . Asthma   . Coronary artery disease    2 stents in 2013  . Diabetes mellitus without complication (HCC)    Type 2  . GERD (gastroesophageal reflux disease)   . History of hiatal hernia   . Hypertension   . Myocardial infarction (HCC)   . Osteoporosis   . Peripheral vascular disease (HCC)   . Pneumonia   . Presence of permanent cardiac pacemaker     Allergies as of 01/29/2019      Reactions   Penicillins Anaphylaxis   Did it involve swelling of the face/tongue/throat, SOB, or low BP? Yes Did it involve sudden or severe rash/hives, skin peeling, or any reaction on the inside of your mouth or nose? No Did you need to seek medical attention at a hospital or doctor's office? Yes When did it last happen?At least 20 years ago If all above answers are "NO", may proceed with cephalosporin use.   Lipitor [atorvastatin] Other (See Comments)   Bleeding      Medication List    STOP taking these medications   ciprofloxacin 500 MG tablet Commonly known as:  CIPRO     TAKE these medications   aspirin EC 81 MG tablet Take 81 mg by mouth daily.   clopidogrel 75 MG tablet Commonly known as:  PLAVIX Take  75 mg by mouth daily.   insulin glargine 100 UNIT/ML injection Commonly known as:  LANTUS Inject 10 Units into the skin every morning.   lisinopril-hydrochlorothiazide 20-12.5 MG tablet Commonly known as:  PRINZIDE,ZESTORETIC Take 1 tablet by mouth daily.   omeprazole 40 MG capsule Commonly known as:  PRILOSEC Take 40 mg by mouth daily.   oxyCODONE 5 MG immediate release tablet Commonly known as:  ROXICODONE Take 1 tablet (5 mg total) by mouth every 6 (six) hours as needed.   sertraline 50 MG tablet Commonly known as:  ZOLOFT Take 75 mg by mouth daily.   vitamin B-12 1000 MCG tablet Commonly known as:  CYANOCOBALAMIN Take 1,000 mcg by mouth daily.        [facility] Discharge Instructions Carotid Endarterectomy (CEA)  Please refer to the following instructions for your post-procedure care. Your surgeon or physician assistant will discuss any changes with you.  Activity  You are encouraged to walk as much as you can. You can slowly return to normal activities but must avoid strenuous activity and heavy lifting until your doctor tell you it's OK. Avoid activities such as vacuuming or swinging a golf club. You can drive after one week if you are comfortable and you are no longer taking prescription pain medications. It is normal to feel tired for serval weeks after your surgery. It is also normal to have difficulty with sleep habits, eating, and bowel movements after surgery. These will go away with time.  Bathing/Showering  You may shower after you come home. Do not soak in a bathtub, hot tub, or swim until the incision heals completely.  Incision Care  Shower every day. Clean your incision with mild soap and water. Pat the area dry with a clean towel. You do not need a bandage unless otherwise instructed. Do not apply any ointments or creams to your incision. You may have skin glue on your incision. Do not peel it off. It will come off on its own in about one week. Your incision may feel thickened and raised for several weeks after your surgery. This is normal and the skin will soften over time. For Men Only: It's OK to shave around the incision but do not shave the incision itself for 2 weeks. It is common to have numbness under your chin that could last for several months.  Diet  Resume your normal diet. There are no special food restrictions following this procedure. A low fat/low cholesterol diet is recommended for all patients with vascular disease. In order to heal from your surgery, it is CRITICAL to get adequate nutrition. Your body requires vitamins, minerals, and protein. Vegetables are the best source of vitamins and minerals. Vegetables  also provide the perfect balance of protein. Processed food has little nutritional value, so try to avoid this.  Medications  Resume taking all of your medications unless your doctor or physician assistant tells you not to.  If your incision is causing pain, you may take over-the- counter pain relievers such as acetaminophen (Tylenol). If you were prescribed a stronger pain medication, please be aware these medications can cause nausea and constipation.  Prevent nausea by taking the medication with a snack or meal. Avoid constipation by drinking plenty of fluids and eating foods with a high amount of fiber, such as fruits, vegetables, and grains.  Do not take Tylenol if you are taking prescription pain medications.  Follow Up  Our office will schedule a follow up  appointment 2-3 weeks following discharge.  Please call us immediately for any of the following conditions  . Increased pain, redness, drainage (pus) from your incision site. . Fever of 101 degrees or higher. . If you should develop stroke (slurred speech, difficulty swallowing, weakness on one side of your body, loss of vision) you should call 911 and go to the nearest emergency room. .  Reduce your risk of vascular disease:  . Stop smoking. If you would like help call [facility] at 1-800-QUIT-NOW (351-038-2846) or [place] at [phone]. . Manage your cholesterol . Maintain a desired weight . Control your diabetes . Keep your blood pressure down .  If you have any questions, please call the office at [phone].  Prescriptions given: 1.   Roxicodone #8 No Refill  Disposition: home  Patient's condition: is Good  Follow up: 1. Dr. Arbie Cookey in 2 weeks.   Doreatha Massed, PA-C [facility] [phone]   --- For [facility] use ---   Modified Rankin score at D/C (0-6): 0  IV medication needed for:  1. Hypertension: No 2. Hypotension: No  Post-op Complications: No  1. Post-op CVA or  TIA: No  If yes: Event classification (right eye, left eye, right cortical, left cortical, verterobasilar, other): n/a  If yes: Timing of event (intra-op, <6 hrs post-op, >=6 hrs post-op, unknown): n/a  2. CN injury: No  If yes: CN n/a injuried   3. Myocardial infarction: No  If yes: Dx by (EKG or clinical, Troponin): n/a  4.  CHF: No  5.  Dysrhythmia (new): No  6. Wound infection: No  7. Reperfusion symptoms: No  8. Return to OR: No  If yes: return to OR for (bleeding, neurologic, other CEA incision, other): n/a  Discharge medications: Statin use:  No allergy ASA use:  Yes   Beta blocker use:  No ACE-Inhibitor use:  Yes  ARB use:  No CCB use: No P2Y12 Antagonist use: No, [ ]  Plavix, [ ]  Plasugrel, [ ]  Ticlopinine, [ ]  Ticagrelor, [ ]  Other, [ ]  No for medical reason, [ ]  Non-compliant, [ ]  Not-indicated Anti-coagulant use:  No, [ ]  Warfarin, [ ]  Rivaroxaban, [ ]  Dabigatran,

## 2019-01-28 NOTE — Op Note (Signed)
    OPERATIVE REPORT  DATE OF SURGERY: 01/28/2019  PATIENT: Lorraine Hill, 85 y.o. female MRN: [id number]  DOB: [birth year]  PRE-OPERATIVE DIAGNOSIS: Severe asymptomatic right internal carotid artery stenosis  POST-OPERATIVE DIAGNOSIS:  Same  PROCEDURE: Right carotid endarterectomy and Dacron patch angioplasty  SURGEON:  Gretta Began, M.D.  PHYSICIAN ASSISTANT: Darlin Coco, PA-C  ANESTHESIA: General  EBL: per anesthesia record  Total I/O In: 900 [I.V.:900] Out: -   BLOOD ADMINISTERED: none  DRAINS: none  SPECIMEN: none  COUNTS CORRECT:  YES  PATIENT DISPOSITION:  PACU - hemodynamically stable  PROCEDURE DETAILS: The patient was taken to the operating placed supine position where the area of the right neck was prepped and draped in usual sterile fashion.  Incision was made anterior sternocleidomastoid and carried down through the platysma electrocautery.  Sternocleidomastoid reflected posteriorly and the carotid sheath was opened.  Facial vein was ligated with 2-0 silk ties and divided.  The vagus and hypoglossal nerves were identified and preserved.  The common carotid artery was encircled with an umbilical tape and Rummel tourniquet.  The superior thyroid artery was encircled with a 2-0 silk Potts tie.  The external carotid was encircled with a blue vessel loop and the internal carotid was encircled with an umbilical tape and Rummel tourniquet.  Patient was given 7000 units of intravenous heparin.  After adequate circulation time the internal/external and common carotid arteries were occluded.  The common carotid artery was opened with 11 blade and sent mostly with Potts's through the plaque onto the internal carotid with Potts scissors.  A 10 shunt was passed up the internal carotid and allowed to backbleed and then down the common carotid where secured with Rummel tourniquet's.  The endarterectomy was again on the common carotid artery and the plaque was divided proximally with  Potts scissors.  The dissection endarterectomy was continued onto the bifurcation.  The external carotid was endarterectomized pectoralis with an eversion technique and the internal carotid was endarterectomized in an open fashion.  Remaining atheromatous debris was removed from the endarterectomy plane.  A Finesse Hemashield Dacron patch was brought onto the field and was sewn as a patch angioplasty with a running 6-0 Prolene suture.  Prior to completion of the closure the shunt was removed and the usual flushing maneuvers were undertaken.  The anastomosis completed and flow was restored to the external and the internal carotid artery.  Excellent flow characteristics were noted with hand-held Doppler in the internal and external carotid arteries.  The patient was given 50 mg of protamine reverse heparin.  Wounds were closed with 3-0 Vicryl to reapproximate sternocleidomastoid over the carotid sheath.  Next the platysma was closed with running 3-0 Vicryl suture and finally the skin was closed with a 4-0 subcuticular Vicryl stitch.  Sterile dressing was applied and the patient was transferred to the recovery room in stable condition.  She was awakened neurologically intact in the operating room   Larina Earthly, M.D., [facility] 01/28/2019 10:30 AM

## 2019-01-28 NOTE — Anesthesia Procedure Notes (Signed)
Procedure Name: Intubation Date/Time: 01/28/2019 8:31 AM Performed by: Margarita Rana, CRNA Pre-anesthesia Checklist: Patient identified, Emergency Drugs available, Suction available and Patient being monitored Oxygen Delivery Method: Circle system utilized Preoxygenation: Pre-oxygenation with 100% oxygen Induction Type: IV induction Ventilation: Oral airway inserted - appropriate to patient size and Mask ventilation without difficulty Laryngoscope Size: Miller and 2 Grade View: Grade I Tube type: Oral Tube size: 7.5 mm Number of attempts: 1 Airway Equipment and Method: Stylet Placement Confirmation: ETT inserted through vocal cords under direct vision,  positive ETCO2,  CO2 detector and breath sounds checked- equal and bilateral Secured at: 21 cm Tube secured with: Tape Dental Injury: Teeth and Oropharynx as per pre-operative assessment  Comments: Performed by Edmonia Lynch

## 2019-01-28 NOTE — Transfer of Care (Signed)
Immediate Anesthesia Transfer of Care Note  Patient: Lorraine Hill  Procedure(s) Performed: Right Carotid ENDARTERECTOMY (Right Neck) Patch Angioplasty of right carotid artery using hemashield platinum finesse patch (Right Neck)  Patient Location: PACU  Anesthesia Type:General  Level of Consciousness: awake, alert  and oriented  Airway & Oxygen Therapy: Patient Spontanous Breathing and Patient connected to face mask oxygen  Post-op Assessment: Report given to RN, Post -op Vital signs reviewed and stable and Patient moving all extremities X 4  Post vital signs: Reviewed and stable  Last Vitals:  Vitals Value Taken Time  BP 153/67 01/28/2019 10:30 AM  Temp    Pulse 82 01/28/2019 10:33 AM  Resp 16 01/28/2019 10:33 AM  SpO2 100 % 01/28/2019 10:33 AM  Vitals shown include unvalidated device data.  Last Pain:  Vitals:   01/28/19 0700  TempSrc:   PainSc: 0-No pain         Complications: No apparent anesthesia complications

## 2019-01-28 NOTE — Progress Notes (Signed)
  Day of Surgery Note    Subjective:  Sleeping; awakes easily and no complaints   Vitals:   01/28/19 0614 01/28/19 1030  BP: (!) 185/52 (!) 153/67  Pulse: 65 81  Resp: 17 14  Temp: 98 F (36.7 C) (!) 97.2 F (36.2 C)  SpO2: 98% 100%    Incisions:   Clean and dry without hematoma Extremities:  Moving all extremities equally Lungs:  Non labored Neuro:  In tact; tongue is midline   Assessment/Plan:  This is a 85 y.o. female who is s/p  Right carotid endarterectomy  -pt doing well in recovery with neuro in tact. -hold lisinopril/hctz due to renal function.  Check labs in am -anticipate discharge tomorrow if today is uneventful.   Doreatha Massed, PA-C 01/28/2019 11:07 AM [phone]

## 2019-01-28 NOTE — Anesthesia Procedure Notes (Signed)
Arterial Line Insertion Start/End2/05/2019 7:45 AM Performed by: Dorris Singh, MD, Lillyian Heidt, Talbot Grumbling, CRNA  Patient location: Pre-op. Lidocaine 1% used for infiltration Left, radial was placed Catheter size: 20 G Hand hygiene performed  and maximum sterile barriers used   Attempts: 3 Procedure performed without using ultrasound guided technique. Following insertion, dressing applied and Biopatch. Post procedure assessment: normal  Patient tolerated the procedure well with no immediate complications.

## 2019-01-28 NOTE — Progress Notes (Signed)
Pharmacy Antibiotic Note  Lorraine Hill is a 85 y.o. female admitted on 01/28/2019 s/p right carotid endarterectomy.  Pharmacy has been consulted for vancomycin x 2 doses post-op for surgical prophylaxis.  Patient received pre-op vancomycin around 0820.  SCr 1.5, CrCL 25 ml/min.  Plan: Vanc 750mg  IV Q24H x 2 doses Pharmacy will sign off.  Thank you for the consult!   Height: 5' 2.01" (157.5 cm) Weight: 146 lb (66.2 kg) IBW/kg (Calculated) : 50.12  Temp (24hrs), Avg:97.7 F (36.5 C), Min:97.2 F (36.2 C), Max:98 F (36.7 C)  No results for input(s): WBC, CREATININE, LATICACIDVEN, VANCOTROUGH, VANCOPEAK, VANCORANDOM, GENTTROUGH, GENTPEAK, GENTRANDOM, TOBRATROUGH, TOBRAPEAK, TOBRARND, AMIKACINPEAK, AMIKACINTROU, AMIKACIN in the last 168 hours.  Estimated Creatinine Clearance: 24.5 mL/min (A) (by C-G formula based on SCr of 1.5 mg/dL (H)).    Allergies  Allergen Reactions  . Penicillins Anaphylaxis    Did it involve swelling of the face/tongue/throat, SOB, or low BP? Yes Did it involve sudden or severe rash/hives, skin peeling, or any reaction on the inside of your mouth or nose? No Did you need to seek medical attention at a hospital or doctor's office? Yes When did it last happen?At least 20 years ago If all above answers are "NO", may proceed with cephalosporin use.   . Lipitor [Atorvastatin] Other (See Comments)    Bleeding    Gayland Nicol D. Laney Potash, PharmD, BCPS, BCCCP 01/28/2019, 2:08 PM

## 2019-01-28 NOTE — Progress Notes (Signed)
Admitted from PACU. On 2L of O2, will wean as tolerated. A-Line Zeroed, BP elevated, will give PRN Hydralazine. CHG done, CCMD notified. NPO until 2:30. Will continue to monitor.

## 2019-01-28 NOTE — Interval H&P Note (Signed)
History and Physical Interval Note:  01/28/2019 6:53 AM  Lorraine Hill  has presented today for surgery, with the diagnosis of RIGHT CAROTID STENOSIS  The various methods of treatment have been discussed with the patient and family. After consideration of risks, benefits and other options for treatment, the patient has consented to  Procedure(s): ENDARTERECTOMY CAROTID (Right) as a surgical intervention .  The patient's history has been reviewed, patient examined, no change in status, stable for surgery.  I have reviewed the patient's chart and labs.  Questions were answered to the patient's satisfaction.     Gretta Began

## 2019-01-29 ENCOUNTER — Other Ambulatory Visit: Payer: Self-pay

## 2019-01-29 ENCOUNTER — Telehealth: Payer: Self-pay | Admitting: Vascular Surgery

## 2019-01-29 ENCOUNTER — Encounter (HOSPITAL_COMMUNITY): Payer: Self-pay | Admitting: Vascular Surgery

## 2019-01-29 LAB — BASIC METABOLIC PANEL
Anion gap: 9 (ref 5–15)
BUN: 24 mg/dL — ABNORMAL HIGH (ref 8–23)
CO2: 22 mmol/L (ref 22–32)
Calcium: 8.7 mg/dL — ABNORMAL LOW (ref 8.9–10.3)
Chloride: 108 mmol/L (ref 98–111)
Creatinine, Ser: 1.24 mg/dL — ABNORMAL HIGH (ref 0.44–1.00)
GFR calc Af Amer: 46 mL/min — ABNORMAL LOW (ref >60)
GFR calc non Af Amer: 40 mL/min — ABNORMAL LOW (ref >60)
Glucose, Bld: 172 mg/dL — ABNORMAL HIGH (ref 70–99)
Potassium: 4.3 mmol/L (ref 3.5–5.1)
Sodium: 139 mmol/L (ref 135–145)

## 2019-01-29 LAB — CBC
HCT: 31.8 % — ABNORMAL LOW (ref 36.0–46.0)
Hemoglobin: 10.5 g/dL — ABNORMAL LOW (ref 12.0–15.0)
MCH: 29.4 pg (ref 26.0–34.0)
MCHC: 33 g/dL (ref 30.0–36.0)
MCV: 89.1 fL (ref 80.0–100.0)
Platelets: 160 10*3/uL (ref 150–400)
RBC: 3.57 MIL/uL — ABNORMAL LOW (ref 3.87–5.11)
RDW: 13.3 % (ref 11.5–15.5)
WBC: 10 10*3/uL (ref 4.0–10.5)
nRBC: 0 % (ref 0.0–0.2)

## 2019-01-29 LAB — GLUCOSE, CAPILLARY
Glucose-Capillary: 213 mg/dL — ABNORMAL HIGH (ref 70–99)
Glucose-Capillary: 141 mg/dL — ABNORMAL HIGH (ref 70–99)

## 2019-01-29 NOTE — Progress Notes (Addendum)
  Progress Note    01/29/2019 7:17 AM 1 Day Post-Op  Subjective:  Denies stroke like symptoms including slurring speech, changes in vision, one sided weakness.  Tolerating regular diet without choking or N/V.  Feeling ready for discharge home today.   Vitals:   01/28/19 2345 01/29/19 0300  BP: (!) 158/52 (!) 158/47  Pulse: 73   Resp: 15 15  Temp: (!) 97 F (36.1 C) (!) 97.5 F (36.4 C)  SpO2: 96% 94%   Physical Exam: Lungs:  Non labored Incisions:  R neck incision with minimal local swelling and ecchymosis but soft Extremities:  Symmetrical grip strength Neurologic: A&O; CN grossly intact  CBC    Component Value Date/Time   WBC 10.0 01/29/2019 0310   RBC 3.57 (L) 01/29/2019 0310   HGB 10.5 (L) 01/29/2019 0310   HCT 31.8 (L) 01/29/2019 0310   PLT 160 01/29/2019 0310   MCV 89.1 01/29/2019 0310   MCH 29.4 01/29/2019 0310   MCHC 33.0 01/29/2019 0310   RDW 13.3 01/29/2019 0310    BMET    Component Value Date/Time   NA 139 01/29/2019 0310   K 4.3 01/29/2019 0310   CL 108 01/29/2019 0310   CO2 22 01/29/2019 0310   GLUCOSE 172 (H) 01/29/2019 0310   BUN 24 (H) 01/29/2019 0310   CREATININE 1.24 (H) 01/29/2019 0310   CALCIUM 8.7 (L) 01/29/2019 0310   GFRNONAA 40 (L) 01/29/2019 0310   GFRAA 46 (L) 01/29/2019 0310    INR    Component Value Date/Time   INR 1.11 01/21/2019 1032     Intake/Output Summary (Last 24 hours) at 01/29/2019 3785 Last data filed at 01/28/2019 2000 Gross per 24 hour  Intake 1260 ml  Output 300 ml  Net 960 ml     Assessment/Plan:  85 y.o. female is s/p R CEA 1 Day Post-Op   Neuro exam remains at baseline Some swelling and bruising at incision however soft Ok to discharge home this morning Follow up in office in about 2 weeks   Emilie Rutter, PA-C [facility] [phone] 01/29/2019 7:17 AM

## 2019-01-29 NOTE — Telephone Encounter (Signed)
sch appt spk to pt 02/22/2019 930am p/o MD

## 2019-01-29 NOTE — Telephone Encounter (Signed)
-----   Message from Emilie RutterMatthew Eveland, PA-C sent at 01/29/2019  7:21 AM EST -----  Can you schedule an appt for this pt with Dr. Arbie CookeyEarly in about 2 weeks.  PO R CEA Thanks, [employer]Matt

## 2019-01-29 NOTE — Care Management Note (Signed)
Case Management Note Donn Pierini RN, BSN Transitions of Care Unit 4E- RN Case Manager [phone]  Patient Details  Name: Lorraine Hill MRN: [id number] Date of Birth: 09/24/1931  Subjective/Objective:  Pt admitted s/p CEA                  Action/Plan: PTA pt; lived at home, plan to return home post op- no CM needs noted for transition home.   Expected Discharge Date:  01/29/19               Expected Discharge Plan:  Home/Self Care  In-House Referral:  NA  Discharge planning Services  CM Consult  Post Acute Care Choice:  NA Choice offered to:  NA  DME Arranged:    DME Agency:     HH Arranged:    HH Agency:     Status of Service:  Completed, signed off  If discussed at Long Length of Stay Meetings, dates discussed:    Discharge Disposition: home/self care   Additional Comments:  Darrold Span, RN 01/29/2019, 10:17 AM

## 2019-02-22 ENCOUNTER — Ambulatory Visit (INDEPENDENT_AMBULATORY_CARE_PROVIDER_SITE_OTHER): Payer: Self-pay | Admitting: Vascular Surgery

## 2019-02-22 ENCOUNTER — Encounter: Payer: Self-pay | Admitting: Vascular Surgery

## 2019-02-22 VITALS — BP 162/78 | HR 78 | Temp 96.9°F | Resp 20 | Wt 145.0 lb

## 2019-02-22 DIAGNOSIS — I6521 Occlusion and stenosis of right carotid artery: Secondary | ICD-10-CM

## 2019-02-22 NOTE — Progress Notes (Signed)
   Patient name: Lorraine Hill MRN: [id number] DOB: 10-10-1931 Sex: female   [place] office  REASON FOR VISIT: Follow-up carotid endarterectomy on 01/28/2019  HPI: Lorraine Hill is a 85 y.o. female here today for follow-up.  Went uneventful carotid endarterectomy for severe asymptomatic disease.  Interestingly she reports that she is feels better than she has in months.  She reports renewed energy and is walking and doing things that she was not done before surgery.  I explained that this certainly would be an unintended good consequence.  Current Outpatient Medications  Medication Sig Dispense Refill  . aspirin EC 81 MG tablet Take 81 mg by mouth daily.    . clopidogrel (PLAVIX) 75 MG tablet Take 75 mg by mouth daily.    . insulin glargine (LANTUS) 100 UNIT/ML injection Inject 10 Units into the skin every morning.     Marland Kitchen lisinopril-hydrochlorothiazide (PRINZIDE,ZESTORETIC) 20-12.5 MG tablet Take 1 tablet by mouth daily.    Marland Kitchen omeprazole (PRILOSEC) 40 MG capsule Take 40 mg by mouth daily.    . sertraline (ZOLOFT) 50 MG tablet Take 75 mg by mouth daily.     . vitamin B-12 (CYANOCOBALAMIN) 1000 MCG tablet Take 1,000 mcg by mouth daily.     No current facility-administered medications for this visit.      PHYSICAL EXAM: Vitals:   02/22/19 0924  BP: (!) 162/78  Pulse: 78  Resp: 20  Temp: (!) 96.9 F (36.1 C)  TempSrc: Temporal  Weight: 145 lb (65.8 kg)    GENERAL: The patient is a well-nourished female, in no acute distress. The vital signs are documented above. Right neck incision is well-healed with no bruits bilaterally.  Neurologically intact  MEDICAL ISSUES: Status post right carotid endarterectomy and Dacron patch angioplasty.  She will resume full activities.  We will see her again in 9 months with carotid duplex in our [facility] office   Larina Earthly, MD [facility] Tel [phone] Pager  [phone]

## 2019-12-13 ENCOUNTER — Other Ambulatory Visit: Payer: Self-pay

## 2019-12-13 DIAGNOSIS — I6521 Occlusion and stenosis of right carotid artery: Secondary | ICD-10-CM

## 2019-12-14 ENCOUNTER — Ambulatory Visit (HOSPITAL_COMMUNITY)
Admission: RE | Admit: 2019-12-14 | Discharge: 2019-12-14 | Disposition: A | Discharge status: Home / Self Care | Payer: Medicare Other | Source: Ambulatory Visit | Attending: Family | Admitting: Family

## 2019-12-14 ENCOUNTER — Other Ambulatory Visit: Payer: Self-pay

## 2019-12-14 ENCOUNTER — Encounter: Payer: Self-pay | Admitting: Vascular Surgery

## 2019-12-14 ENCOUNTER — Ambulatory Visit (INDEPENDENT_AMBULATORY_CARE_PROVIDER_SITE_OTHER): Payer: Medicare Other | Admitting: Vascular Surgery

## 2019-12-14 VITALS — BP 126/75 | HR 60 | Temp 97.8°F | Resp 20 | Ht 62.0 in | Wt 145.0 lb

## 2019-12-14 DIAGNOSIS — I6521 Occlusion and stenosis of right carotid artery: Secondary | ICD-10-CM | POA: Insufficient documentation

## 2019-12-14 NOTE — Progress Notes (Signed)
[facility]  Patient name: Lorraine Hill MRN: [id number] DOB: 26-Apr-1931 Sex: female  REASON FOR VISIT: Follow-up carotid endarterectomy on 01/28/2019  HPI: Lorraine Hill is a 85 y.o. female here today for continued follow-up.  I am also discussing this with her daughter by telephone.  She reports no new difficulties.  She does have some persistent hypersensitivity around her incision and also at her earlobe.  She reports this made it somewhat difficult to wear her hearing aid.  She denies any focal neurologic deficits.  She has remained stable from a cardiac standpoint  Past Medical History:  Diagnosis Date  . Arthritis   . Asthma   . Coronary artery disease    2 stents in 2013  . Diabetes mellitus without complication (HCC)    Type 2  . GERD (gastroesophageal reflux disease)   . History of hiatal hernia   . Hypertension   . Myocardial infarction ([place])   . Osteoporosis   . Peripheral vascular disease ([place])   . Pneumonia   . Presence of permanent cardiac pacemaker     History reviewed. No pertinent family history.  SOCIAL HISTORY: Social History   Tobacco Use  . Smoking status: Former Smoker    Quit date: 01/21/1981    Years since quitting: 38.9  . Smokeless tobacco: Never Used  Substance Use Topics  . Alcohol use: Never    Allergies  Allergen Reactions  . Penicillins Anaphylaxis    Did it involve swelling of the face/tongue/throat, SOB, or low BP? Yes Did it involve sudden or severe rash/hives, skin peeling, or any reaction on the inside of your mouth or nose? No Did you need to seek medical attention at a hospital or doctor's office? Yes When did it last happen?At least 20 years ago If all above answers are "NO", may proceed with cephalosporin use.   . Lipitor [Atorvastatin] Other (See Comments)    Bleeding  . Lisinopril     cough    Current Outpatient Medications  Medication Sig Dispense  Refill  . albuterol (VENTOLIN HFA) 108 (90 Base) MCG/ACT inhaler     . aspirin EC 81 MG tablet Take 81 mg by mouth daily.    . carvedilol (COREG) 12.5 MG tablet Take 12.5 mg by mouth 2 (two) times daily.    . clopidogrel (PLAVIX) 75 MG tablet Take 75 mg by mouth daily.    Marland Kitchen glipiZIDE (GLUCOTROL XL) 10 MG 24 hr tablet TAKE 1 TABLET BY MOUTH EVERY DAY WITH BREAKFAST FOR DIABETES    . insulin glargine (LANTUS) 100 UNIT/ML injection Inject 10 Units into the skin every morning.     Marland Kitchen lisinopril-hydrochlorothiazide (PRINZIDE,ZESTORETIC) 20-12.5 MG tablet Take 1 tablet by mouth daily.    Marland Kitchen omeprazole (PRILOSEC) 40 MG capsule Take 40 mg by mouth daily.    . sertraline (ZOLOFT) 50 MG tablet Take 75 mg by mouth daily.     . vitamin B-12 (CYANOCOBALAMIN) 1000 MCG tablet Take 1,000 mcg by mouth daily.     No current facility-administered medications for this visit.    REVIEW OF SYSTEMS:  [X]  denotes positive finding, [ ]  denotes negative finding Cardiac  Comments:  Chest pain or chest pressure:    Shortness of breath upon exertion:    Short of breath when lying flat:    Irregular heart rhythm:        Vascular    Pain in calf, thigh, or hip brought on by ambulation:  Pain in feet at night that wakes you up from your sleep:     Blood clot in your veins:    Leg swelling:           PHYSICAL EXAM: Vitals:   12/14/19 0936 12/14/19 0938  BP: 110/62 126/75  Pulse: 60   Resp: 20   Temp: 97.8 F (36.6 C)   SpO2: 95%   Weight: 145 lb (65.8 kg)   Height: 5\' 2"  (1.575 m)     GENERAL: The patient is a well-nourished female, in no acute distress. The vital signs are documented above. CARDIOVASCULAR: Well-healed right carotid incision with no bruits bilaterally. PULMONARY: There is good air exchange  MUSCULOSKELETAL: There are no major deformities or cyanosis. NEUROLOGIC: No focal weakness or paresthesias are detected. SKIN: There are no ulcers or rashes noted. PSYCHIATRIC: The patient has a  normal affect.  DATA:  Carotid duplex today reveals widely patent right carotid endarterectomy.  The left carotid has approximately a 40 to 59% stenosis  MEDICAL ISSUES: Stable overall following right carotid endarterectomy in February 2020.  We will continue full activities.  We will see her again in 1 year with repeat carotid duplex.  She knows to report immediately should she develop any neurologic deficits    March 2020, MD [facility] Tel [phone] Pager [phone]

## 2019-12-30 ENCOUNTER — Other Ambulatory Visit: Payer: Self-pay | Admitting: *Deleted

## 2019-12-30 DIAGNOSIS — I6521 Occlusion and stenosis of right carotid artery: Secondary | ICD-10-CM

## 2020-07-05 IMAGING — CT CT ANGIO NECK
2 of 7 series · 8 of 33 positions shown · IV contrast (Isovue)
Comparison: None.

CLINICAL DATA: Follow-up carotid stenosis

EXAM:
CT ANGIOGRAPHY NECK
TECHNIQUE: Multidetector CT imaging of the neck was performed using the
standard protocol during bolus administration of intravenous
contrast. Multiplanar CT image reconstructions and MIPs were
obtained to evaluate the vascular anatomy. Carotid stenosis
measurements (when applicable) are obtained utilizing NASCET
criteria, using the distal internal carotid diameter as the
denominator.
CONTRAST:  75mL FLWAJF-YQV IOPAMIDOL (FLWAJF-YQV) INJECTION 76%

[Series 5: cta neck · axial · 0.47mm/px · z∈[+1159,+1241]mm · 2 of 119 slices shown]
[im 40/119  soft-tissue]
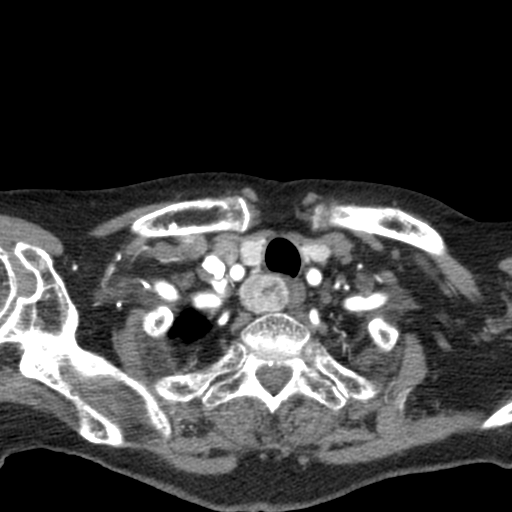
[im 79/119  soft-tissue]
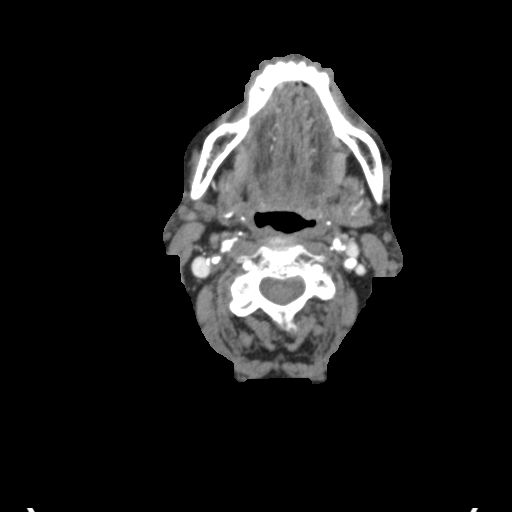

[Series 7: ax thin · axial · 0.39mm/px · z∈[+1116,+1288]mm · 6 of 239 slices shown]
[im 35/239  soft-tissue]
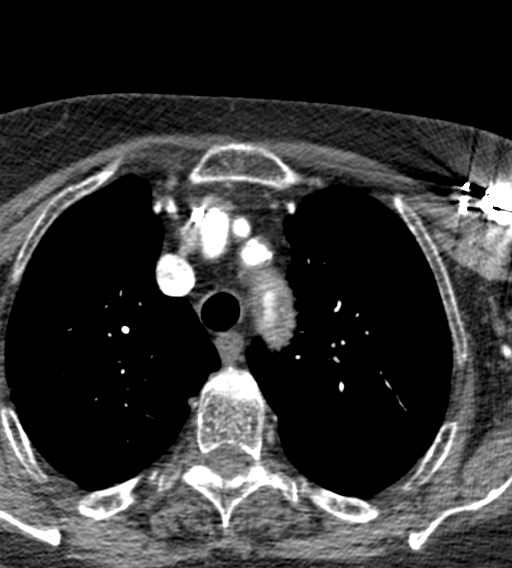
[im 69/239  bone]
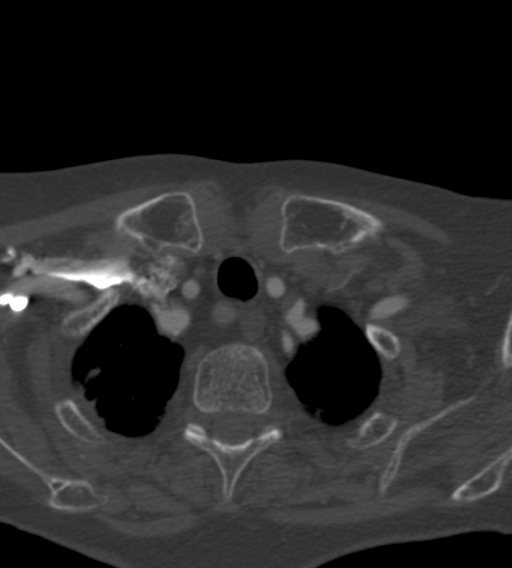
[im 103/239  soft-tissue]
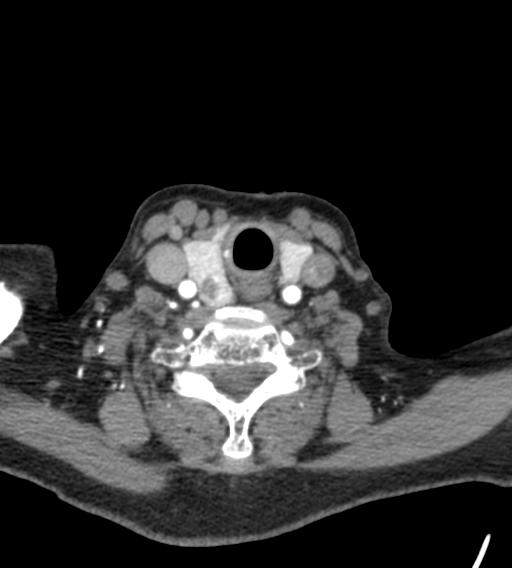
[im 137/239  bone]
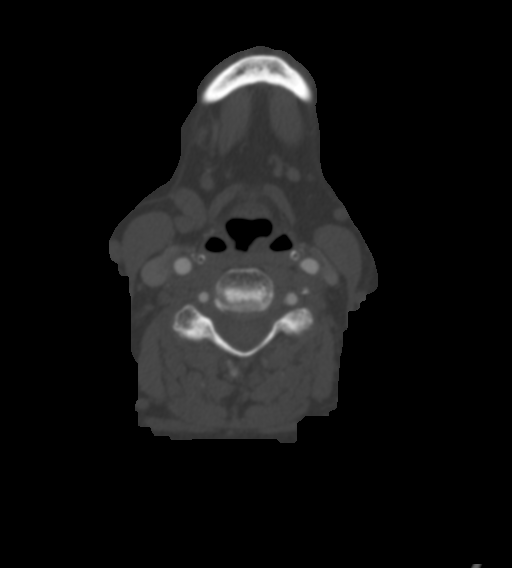
[im 171/239  soft-tissue]
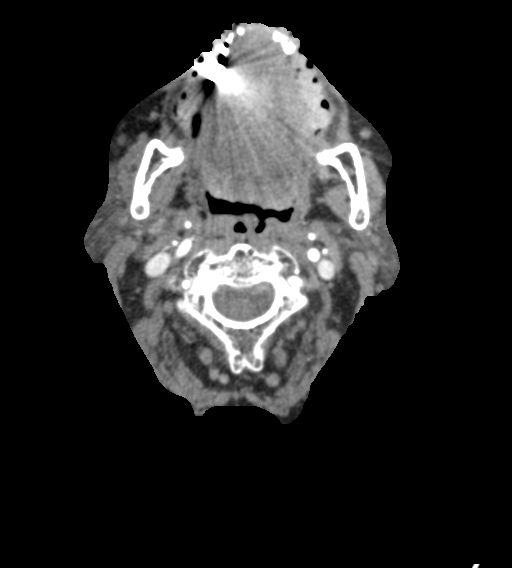
[im 205/239  bone]
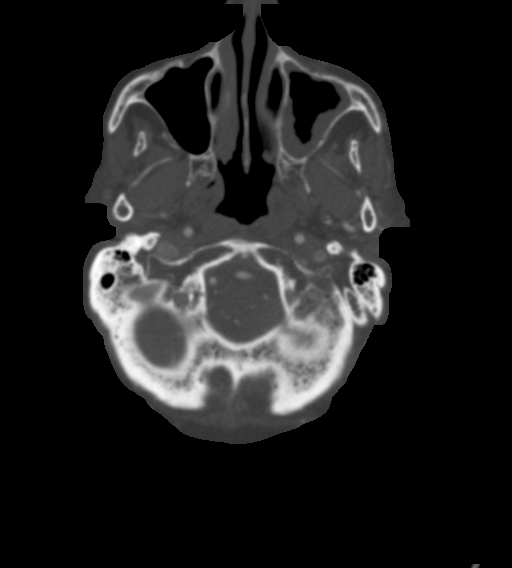

[8 of 33 positions shown; findings below may reference images not displayed]

FINDINGS: Aortic arch: Extensive atherosclerotic plaque. No evident dilatation
or dissection flap. Three vessel branching

Right carotid system: Mainly low-density atheromatous plaque at the
common carotid bifurcation and ICA bulb. Bulb stenosis measures up
to 75% (on sagittal reformats). No ulceration or downstream beading.

Left carotid system: Moderate mixed density plaque at the common
carotid bifurcation without flow limiting stenosis or ulceration.

Vertebral arteries: No proximal subclavian flow limiting stenosis
despite atherosclerosis greater on the left. Plaque at the right
vertebral origin without flow limiting stenosis. Both vertebral
arteries are widely patent to the dura.

Skeleton: No acute or aggressive finding

Other neck: Active sinusitis with mucosal thickening in the left
maxillary and left more than right ethmoid sinuses. Retained
secretions in the left maxillary sinus.

Exophytic right thyroid nodule projecting posteriorly and exerting
mass effect on the esophagus, 2.5 cm in maximal axial dimension.

Upper chest: Biapical scarring.  Generalized airway thickening.
IMPRESSION: 1. 75% atheromatous narrowing at the right ICA bulb.
2. No other flow limiting stenosis in the neck.
3. 2.7 cm right thyroid nodule which could be followed with
sonography if appropriate for comorbidities.
4. Active sinusitis.

## 2023-05-15 ENCOUNTER — Ambulatory Visit: Payer: Medicare Other | Admitting: Neurology
